# Patient Record
Sex: Female | Born: 1955
Health system: Southern US, Community
[De-identification: ages and names within clinical notes are randomized; demographics above are authoritative.]

## PROBLEM LIST (undated history)

## (undated) DIAGNOSIS — R3129 Other microscopic hematuria: Secondary | ICD-10-CM

## (undated) DIAGNOSIS — Q631 Lobulated, fused and horseshoe kidney: Secondary | ICD-10-CM

## (undated) DIAGNOSIS — R87613 High grade squamous intraepithelial lesion on cytologic smear of cervix (HGSIL): Secondary | ICD-10-CM

## (undated) DIAGNOSIS — I700 Atherosclerosis of aorta: Secondary | ICD-10-CM

## (undated) DIAGNOSIS — I10 Essential (primary) hypertension: Secondary | ICD-10-CM

## (undated) DIAGNOSIS — E7800 Pure hypercholesterolemia, unspecified: Secondary | ICD-10-CM

## (undated) DIAGNOSIS — N891 Moderate vaginal dysplasia: Secondary | ICD-10-CM

## (undated) DIAGNOSIS — R87619 Unspecified abnormal cytological findings in specimens from cervix uteri: Secondary | ICD-10-CM

## (undated) DIAGNOSIS — D3500 Benign neoplasm of unspecified adrenal gland: Secondary | ICD-10-CM

## (undated) HISTORY — PX: PARTIAL HYSTERECTOMY: SHX80

## (undated) HISTORY — PX: COLPOSCOPY: SHX161

## (undated) HISTORY — DX: High grade squamous intraepithelial lesion on cytologic smear of cervix (HGSIL): R87.613

## (undated) HISTORY — DX: Unspecified abnormal cytological findings in specimens from cervix uteri: R87.619

## (undated) HISTORY — DX: Essential (primary) hypertension: I10

## (undated) HISTORY — DX: Pure hypercholesterolemia, unspecified: E78.00

---

## 2000-10-26 ENCOUNTER — Other Ambulatory Visit: Admission: RE | Admit: 2000-10-26 | Discharge: 2000-10-26 | Payer: Self-pay | Admitting: Obstetrics and Gynecology

## 2001-02-11 ENCOUNTER — Encounter: Payer: Self-pay | Admitting: Family Medicine

## 2001-02-11 ENCOUNTER — Ambulatory Visit (HOSPITAL_COMMUNITY): Admission: RE | Admit: 2001-02-11 | Discharge: 2001-02-11 | Payer: Self-pay | Admitting: Family Medicine

## 2001-07-04 ENCOUNTER — Ambulatory Visit (HOSPITAL_COMMUNITY): Admission: RE | Admit: 2001-07-04 | Discharge: 2001-07-04 | Payer: Self-pay | Admitting: Family Medicine

## 2001-07-04 ENCOUNTER — Encounter: Payer: Self-pay | Admitting: Family Medicine

## 2001-11-14 ENCOUNTER — Ambulatory Visit (HOSPITAL_COMMUNITY): Admission: RE | Admit: 2001-11-14 | Discharge: 2001-11-14 | Payer: Self-pay | Admitting: Internal Medicine

## 2002-02-24 ENCOUNTER — Ambulatory Visit (HOSPITAL_COMMUNITY): Admission: RE | Admit: 2002-02-24 | Discharge: 2002-02-24 | Payer: Self-pay | Admitting: Family Medicine

## 2002-02-24 ENCOUNTER — Encounter: Payer: Self-pay | Admitting: Family Medicine

## 2002-07-08 ENCOUNTER — Encounter: Payer: Self-pay | Admitting: Family Medicine

## 2002-07-08 ENCOUNTER — Ambulatory Visit (HOSPITAL_COMMUNITY): Admission: RE | Admit: 2002-07-08 | Discharge: 2002-07-08 | Payer: Self-pay | Admitting: Family Medicine

## 2003-03-09 ENCOUNTER — Ambulatory Visit (HOSPITAL_COMMUNITY): Admission: RE | Admit: 2003-03-09 | Discharge: 2003-03-09 | Payer: Self-pay | Admitting: Family Medicine

## 2003-12-16 ENCOUNTER — Ambulatory Visit (HOSPITAL_COMMUNITY): Admission: RE | Admit: 2003-12-16 | Discharge: 2003-12-16 | Payer: Self-pay | Admitting: Family Medicine

## 2004-01-24 ENCOUNTER — Emergency Department (HOSPITAL_COMMUNITY): Admission: EM | Admit: 2004-01-24 | Discharge: 2004-01-25 | Payer: Self-pay | Admitting: Emergency Medicine

## 2004-10-12 ENCOUNTER — Emergency Department: Payer: Self-pay | Admitting: General Practice

## 2004-10-21 ENCOUNTER — Other Ambulatory Visit: Payer: Self-pay

## 2004-10-21 ENCOUNTER — Ambulatory Visit: Payer: Self-pay | Admitting: Family Medicine

## 2004-10-27 ENCOUNTER — Emergency Department: Payer: Self-pay | Admitting: Emergency Medicine

## 2004-10-27 ENCOUNTER — Other Ambulatory Visit: Payer: Self-pay

## 2005-02-17 ENCOUNTER — Ambulatory Visit (HOSPITAL_COMMUNITY): Admission: RE | Admit: 2005-02-17 | Discharge: 2005-02-17 | Payer: Self-pay | Admitting: Family Medicine

## 2007-12-17 ENCOUNTER — Emergency Department: Payer: Self-pay | Admitting: Emergency Medicine

## 2008-04-24 ENCOUNTER — Emergency Department: Payer: Self-pay | Admitting: Emergency Medicine

## 2009-06-09 ENCOUNTER — Ambulatory Visit: Payer: Self-pay | Admitting: Internal Medicine

## 2009-07-21 ENCOUNTER — Emergency Department: Payer: Self-pay | Admitting: Emergency Medicine

## 2009-08-20 ENCOUNTER — Ambulatory Visit: Payer: Self-pay | Admitting: Gastroenterology

## 2009-08-24 ENCOUNTER — Ambulatory Visit: Payer: Self-pay | Admitting: Gastroenterology

## 2010-06-28 ENCOUNTER — Ambulatory Visit: Payer: Self-pay | Admitting: Internal Medicine

## 2011-07-12 ENCOUNTER — Ambulatory Visit: Payer: Self-pay | Admitting: Internal Medicine

## 2012-07-16 ENCOUNTER — Ambulatory Visit: Payer: Self-pay

## 2013-07-23 ENCOUNTER — Ambulatory Visit: Payer: Self-pay | Admitting: Internal Medicine

## 2013-11-11 ENCOUNTER — Ambulatory Visit: Payer: Self-pay | Admitting: Obstetrics and Gynecology

## 2013-11-11 LAB — BASIC METABOLIC PANEL
ANION GAP: 6 — AB (ref 7–16)
BUN: 12 mg/dL (ref 7–18)
CALCIUM: 9 mg/dL (ref 8.5–10.1)
CHLORIDE: 103 mmol/L (ref 98–107)
CREATININE: 0.83 mg/dL (ref 0.60–1.30)
Co2: 30 mmol/L (ref 21–32)
Glucose: 89 mg/dL (ref 65–99)
Osmolality: 277 (ref 275–301)
Potassium: 4.1 mmol/L (ref 3.5–5.1)
Sodium: 139 mmol/L (ref 136–145)

## 2013-11-11 LAB — HEMOGLOBIN: HGB: 12.8 g/dL (ref 12.0–16.0)

## 2013-11-21 ENCOUNTER — Ambulatory Visit: Payer: Self-pay | Admitting: Obstetrics and Gynecology

## 2013-11-22 LAB — BASIC METABOLIC PANEL
ANION GAP: 9 (ref 7–16)
BUN: 9 mg/dL (ref 7–18)
CALCIUM: 8 mg/dL — AB (ref 8.5–10.1)
CREATININE: 0.88 mg/dL (ref 0.60–1.30)
Chloride: 105 mmol/L (ref 98–107)
Co2: 28 mmol/L (ref 21–32)
EGFR (Non-African Amer.): >60
Glucose: 97 mg/dL (ref 65–99)
Osmolality: 282 (ref 275–301)
Potassium: 3.9 mmol/L (ref 3.5–5.1)
SODIUM: 142 mmol/L (ref 136–145)

## 2013-11-22 LAB — HEMATOCRIT: HCT: 34.9 % — AB (ref 35.0–47.0)

## 2013-11-26 LAB — PATHOLOGY REPORT

## 2014-07-27 ENCOUNTER — Ambulatory Visit
Admit: 2014-07-27 | Disposition: A | Discharge status: Home / Self Care | Payer: Self-pay | Attending: Internal Medicine | Admitting: Internal Medicine

## 2014-08-01 NOTE — Op Note (Signed)
PATIENT NAME:  Kathleen Riddle, Kathleen Riddle MR#:  [id number] DATE OF BIRTH:  January 11, 1956  DATE OF PROCEDURE:  11/21/2013  PREOPERATIVE DIAGNOSIS: Persistent cervical dysplasia.   POSTOPERATIVE DIAGNOSIS: Persistent cervical dysplasia.   PROCEDURES:  1.  Total vaginal hysterectomy.  2.  Bilateral salpingectomy.   SURGEON: Boykin Nearing, M.D.   ANESTHESIA: General endotracheal.  INDICATIONS: A 59 year old gravida 3, para 3, a patient with persistent cervical dysplasia. The patient is status post a LEEP procedure in June 2014 that showed HGSIL with positive margins. A repeat colposcopic evaluation in July 2015 showed high-grade squamous intraepithelial neoplasia and positive endocervical curettage.   DESCRIPTION OF PROCEDURE: After adequate general endotracheal anesthesia, the patient was placed in the dorsal supine position with the legs in the candy-cane stirrups. The patient's lower abdomen, perineum, and vagina were prepped and draped in normal sterile fashion. She did receive 2 grams IV cefoxitin prior to commencement of the case. A Red Robinson catheter was used to straight catheterize the bladder which yielded 100 mL clear urine. A weighted speculum was placed in the posterior vaginal vault and the anterior vagina was elevated with a Sims retractor. The cervix was grasped with 2 thyroid tenacula and circumferentially injected with 1% lidocaine with 1:100,000 epinephrine. A direct posterior colpotomy incision was made. Once entrance was accomplished, a long bill weighted speculum was placed. The uterosacral ligaments were then bilaterally clamped, transected and suture ligated with 0 Vicryl suture and tagged for later identification. The anterior cervix was circumferentially incised with the Bovie. The anterior cul-de-sac was entered sharply without difficulty. The cardinal ligaments were then bilaterally clamped, transected and suture ligated with 0 Vicryl suture. A Deaver retractor was placed in the  anterior cul-de-sac to reflect the bladder anteriorly. Uterine arteries were bilaterally clamped, transected and suture ligated with 0 Vicryl suture. The cornua were then bilaterally clamped, transected and the uterus was delivered without difficulty. The pedicles were ligated with 0 Vicryl suture. Each fallopian tube was grasped with a Babcock clamp and a Heaney-Ballantine clamp was used to clamp the fallopian tube and each fallopian tube was removed and an 0 Vicryl suture was used to tie the pedicle. Good hemostasis was noted. The ovaries were small and appeared normal. Good hemostasis was noted. The peritoneum was then closed with a 2-0 PDS suture in a pursestring fashion and the vaginal cuff was then closed with a running 0 Vicryl suture. The uterosacral ligaments were plicated centrally and the rest of the vaginal vault was closed without difficulty. Good hemostasis was noted. There were no complications. Foley catheter was placed at the end of the case yielding clear urine. Estimated blood loss 25 mL. Intraoperative fluids 800 mL. Urine output 150 mL. The patient was taken to the recovery room in good condition.   ____________________________ Boykin Nearing, MD tjs:sb D: 11/21/2013 08:52:47 ET T: 11/21/2013 11:58:42 ET JOB#: [id number]  cc: Boykin Nearing, MD, <Dictator> Boykin Nearing MD ELECTRONICALLY SIGNED 11/21/2013 17:19

## 2015-07-28 ENCOUNTER — Other Ambulatory Visit: Payer: Self-pay | Admitting: Internal Medicine

## 2015-07-28 DIAGNOSIS — Z1231 Encounter for screening mammogram for malignant neoplasm of breast: Secondary | ICD-10-CM

## 2015-08-11 ENCOUNTER — Ambulatory Visit: Payer: Self-pay | Attending: Internal Medicine

## 2015-09-02 ENCOUNTER — Ambulatory Visit: Payer: Self-pay | Attending: Internal Medicine

## 2015-10-01 ENCOUNTER — Ambulatory Visit
Admission: RE | Admit: 2015-10-01 | Discharge: 2015-10-01 | Disposition: A | Discharge status: Home / Self Care | Payer: Commercial Managed Care - HMO | Source: Ambulatory Visit | Attending: Internal Medicine | Admitting: Internal Medicine

## 2015-10-01 ENCOUNTER — Other Ambulatory Visit: Payer: Self-pay | Admitting: Internal Medicine

## 2015-10-01 DIAGNOSIS — Z1231 Encounter for screening mammogram for malignant neoplasm of breast: Secondary | ICD-10-CM

## 2016-05-05 DIAGNOSIS — R87613 High grade squamous intraepithelial lesion on cytologic smear of cervix (HGSIL): Secondary | ICD-10-CM | POA: Diagnosis not present

## 2016-05-05 DIAGNOSIS — I10 Essential (primary) hypertension: Secondary | ICD-10-CM | POA: Diagnosis not present

## 2016-07-31 ENCOUNTER — Other Ambulatory Visit: Payer: Self-pay | Admitting: Internal Medicine

## 2016-07-31 DIAGNOSIS — Z1231 Encounter for screening mammogram for malignant neoplasm of breast: Secondary | ICD-10-CM

## 2016-10-02 ENCOUNTER — Ambulatory Visit
Admission: RE | Admit: 2016-10-02 | Discharge: 2016-10-02 | Disposition: A | Discharge status: Home / Self Care | Payer: Commercial Managed Care - HMO | Source: Ambulatory Visit | Attending: Internal Medicine | Admitting: Internal Medicine

## 2016-10-02 DIAGNOSIS — Z1231 Encounter for screening mammogram for malignant neoplasm of breast: Secondary | ICD-10-CM | POA: Diagnosis not present

## 2016-10-03 ENCOUNTER — Encounter: Payer: Self-pay | Admitting: Obstetrics and Gynecology

## 2016-10-03 ENCOUNTER — Ambulatory Visit (INDEPENDENT_AMBULATORY_CARE_PROVIDER_SITE_OTHER): Payer: 59 | Admitting: Obstetrics and Gynecology

## 2016-10-03 VITALS — BP 134/74 | Ht 62.0 in | Wt 154.0 lb

## 2016-10-03 DIAGNOSIS — N891 Moderate vaginal dysplasia: Secondary | ICD-10-CM

## 2016-10-03 NOTE — Progress Notes (Signed)
Obstetrics & Gynecology Office Visit   Chief Complaint  Patient presents with  . Referral appointment for history of VAIN   History of Present Illness: 61 y.o. G32P3003 female who presents in referral from Dr. Glendon Axe at [facility] for history of VAIN 2. She had been followed at [facility] for this and was last seen for this issue in June 2016, where she had a vaginal biopsy at 9 o'clock on the vagina, which was consistent with VAIN 2.  She has had no follow up for this since that time.  She reports no issues. She denies vaginal bleeding.  Past Medical History:  Diagnosis Date  . Abnormal Pap smear of cervix   . HGSIL (high grade squamous intraepithelial lesion) on Pap smear of cervix   . Hypercholesteremia   . Hypertension     Past Surgical History:  Procedure Laterality Date  . COLPOSCOPY    . PARTIAL HYSTERECTOMY      Gynecologic History: No LMP recorded. Patient has had a hysterectomy.  Obstetric History: [id number]  Family History  Problem Relation Age of Onset  . Breast cancer Neg Hx     Social History   Social History  . Marital status: Divorced    Spouse name: N/A  . Number of children: N/A  . Years of education: N/A   Occupational History  . Not on file.   Social History Main Topics  . Smoking status: Current Every Day Smoker  . Smokeless tobacco: Never Used  . Alcohol use No  . Drug use: No  . Sexual activity: Yes   Other Topics Concern  . Not on file   Social History Narrative  . No narrative on file    No Known Allergies  Prior to Admission medications   Not on File    Review of Systems  Constitutional: Negative.   HENT: Negative.   Eyes: Negative.   Respiratory: Negative.   Cardiovascular: Negative.   Gastrointestinal: Negative.   Genitourinary: Negative.   Musculoskeletal: Negative.   Skin: Negative.   Neurological: Negative.   Psychiatric/Behavioral: Negative.      Physical Exam BP 134/74   Ht 5\' 2"  (1.575 m)    Wt 154 lb (69.9 kg)   BMI 28.17 kg/m  No LMP recorded. Patient has had a hysterectomy. Physical Exam  Constitutional: She is oriented to person, place, and time. She appears well-developed and well-nourished. No distress.  Genitourinary: Vagina normal. Pelvic exam was performed with patient supine. There is no rash, tenderness, lesion or injury on the right labia. There is no rash, tenderness, lesion or injury on the left labia. Vagina exhibits no lesion. No erythema, tenderness or bleeding in the vagina. No foreign body in the vagina. No signs of injury around the vagina. No vaginal discharge found. Right adnexum does not display mass, does not display tenderness and does not display fullness. Left adnexum does not display tenderness and does not display fullness.  Genitourinary Comments: Cervix surgically absent as well as uterus. Sample taken for pap smear of vagina.   HENT:  Head: Normocephalic and atraumatic.  Eyes: EOM are normal. No scleral icterus.  Neck: Normal range of motion. Neck supple.  Cardiovascular: Normal rate and regular rhythm.   Pulmonary/Chest: Effort normal and breath sounds normal. No respiratory distress. She has no wheezes. She has no rales.  Abdominal: Soft. Bowel sounds are normal. She exhibits no distension and no mass. There is no tenderness. There is no rebound and no guarding.  Musculoskeletal:  Normal range of motion. She exhibits no edema.  Neurological: She is alert and oriented to person, place, and time. No cranial nerve deficit.  Skin: Skin is warm and dry. No erythema.  Psychiatric: She has a normal mood and affect. Her behavior is normal. Judgment normal.   Female chaperone present for pelvic and breast  portions of the physical exam  Assessment: 61 y.o. G37P3003 female seen in referral for VAIN 2.  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 We will arrange for colposcopy of vagina to ensure resolution of VAIN 2.    30 minutes spent in face to face discussion with > 50% spent in counseling and management of her VAIN 2 and the high importance of follow up so that she does not develop cancer of the vagina.   Prentice Docker, MD 10/06/2016 1:20 PM    CC: Dr. Glendon Axe [facility] [place], [place]

## 2016-10-07 LAB — IGP, APTIMA HPV, RFX 16/18,45
HPV Aptima: POSITIVE — AB
PAP SMEAR COMMENT: 0

## 2016-10-12 ENCOUNTER — Encounter: Payer: Self-pay | Admitting: Obstetrics and Gynecology

## 2016-10-27 DIAGNOSIS — I10 Essential (primary) hypertension: Secondary | ICD-10-CM | POA: Diagnosis not present

## 2016-10-27 DIAGNOSIS — Z131 Encounter for screening for diabetes mellitus: Secondary | ICD-10-CM | POA: Diagnosis not present

## 2016-11-01 ENCOUNTER — Ambulatory Visit (INDEPENDENT_AMBULATORY_CARE_PROVIDER_SITE_OTHER): Payer: 59 | Admitting: Obstetrics and Gynecology

## 2016-11-01 ENCOUNTER — Encounter: Payer: Self-pay | Admitting: Obstetrics and Gynecology

## 2016-11-01 VITALS — BP 128/78 | Ht 62.0 in | Wt 155.0 lb

## 2016-11-01 DIAGNOSIS — N891 Moderate vaginal dysplasia: Secondary | ICD-10-CM

## 2016-11-01 NOTE — Progress Notes (Addendum)
Referring Provider:  Dr. Glendon Axe at [facility]  HPI:  Kathleen Riddle is a 61 y.o.  [phone]  who presents today for evaluation and management of abnormal cervical cytology.    Dysplasia History:  VAIN 2 in 2016. Patient is status post TVH for persistent cervical dysplasia.   ROS:  Pertinent items are noted in HPI.  OB History  Gravida Para Term Preterm AB Living  3 3 3     3   SAB TAB Ectopic Multiple Live Births          3    # Outcome Date GA Lbr Len/2nd Weight Sex Delivery Anes PTL Lv  3 Term      Vag-Spont   LIV  2 Term      Vag-Spont     1 Term      Vag-Spont         Past Medical History:  Diagnosis Date  . Abnormal Pap smear of cervix   . HGSIL (high grade squamous intraepithelial lesion) on Pap smear of cervix   . Hypercholesteremia   . Hypertension     Past Surgical History:  Procedure Laterality Date  . COLPOSCOPY    . PARTIAL HYSTERECTOMY      SOCIAL HISTORY: History  Alcohol Use No   History  Drug Use No     Family History  Problem Relation Age of Onset  . Breast cancer Neg Hx     ALLERGIES:  Patient has no known allergies.  No current outpatient prescriptions on file prior to visit.   No current facility-administered medications on file prior to visit.     Physical Exam: -Vitals:  BP 128/78   Ht 5\' 2"  (1.575 m)   Wt 155 lb (70.3 kg)   BMI 28.35 kg/m  GEN: WD, WN, NAD.  A+ O x 3, good mood and affect. ABD:  NT, ND.  Soft, no masses.  No hernias noted.   Pelvic:   Vulva: Normal appearance.  No lesions.  Vagina: No lesions or abnormalities noted.  Support: Normal pelvic support.  Urethra No masses tenderness or scarring.  Meatus Normal size without lesions or prolapse.  Cervix: See below.  Anus: Normal exam.  No lesions.  Perineum: Normal exam.  No lesions.        Bimanual   Uterus: Normal size.  Non-tender.  Mobile.  AV.  Adnexae: No masses.  Non-tender to palpation.  Cul-de-sac: Negative for abnormality.    PROCEDURE: 1.  Urine Pregnancy Test:  not done 2.  Colposcopy performed with 4% acetic acid after verbal consent obtained                                         -Aceto-white Lesions Location(s): 9 o'clock on vaginal cuff              -Biopsy performed at 9 o'clock               -ECC indicated and performed: No.     -Biopsy sites made hemostatic with pressure, AgNO3, and/or Monsel's solution   -Satisfactory colposcopy: Yes.      -Evidence of Invasive vaginal CA :  NO  ASSESSMENT:  Kathleen Riddle is a 61 y.o. S9G2836 here for  1. VAIN II (vaginal intraepithelial neoplasia grade II)    PLAN: I discussed the grading system of pap smears and HPV high  risk viral types.  We will discuss and base management after colpo results return. Follow up PAP 6 months, vs intervention if high grade dysplasia identified      Prentice Docker, MD  [place] 11/02/2016  9:15 AM   ADDENDUM: Biopsy result shows benign squamous epithelium.  Recommend repeat pap smear with HPV testing in one year.  Letter has been sent to patient.  CC: Dr. Glendon Axe [facility], [place]

## 2016-11-03 DIAGNOSIS — Z780 Asymptomatic menopausal state: Secondary | ICD-10-CM | POA: Diagnosis not present

## 2016-11-03 DIAGNOSIS — Z0000 Encounter for general adult medical examination without abnormal findings: Secondary | ICD-10-CM | POA: Diagnosis not present

## 2016-11-03 DIAGNOSIS — M25562 Pain in left knee: Secondary | ICD-10-CM | POA: Diagnosis not present

## 2016-11-03 DIAGNOSIS — R3129 Other microscopic hematuria: Secondary | ICD-10-CM | POA: Diagnosis not present

## 2016-11-03 LAB — PATHOLOGY

## 2016-11-06 ENCOUNTER — Encounter: Payer: Self-pay | Admitting: Obstetrics and Gynecology

## 2016-11-14 DIAGNOSIS — Z780 Asymptomatic menopausal state: Secondary | ICD-10-CM | POA: Diagnosis not present

## 2016-12-06 DIAGNOSIS — M778 Other enthesopathies, not elsewhere classified: Secondary | ICD-10-CM | POA: Diagnosis not present

## 2016-12-06 DIAGNOSIS — R3129 Other microscopic hematuria: Secondary | ICD-10-CM | POA: Diagnosis not present

## 2016-12-12 ENCOUNTER — Other Ambulatory Visit: Payer: Self-pay | Admitting: Internal Medicine

## 2016-12-12 DIAGNOSIS — R3129 Other microscopic hematuria: Secondary | ICD-10-CM

## 2016-12-21 ENCOUNTER — Ambulatory Visit
Admission: RE | Admit: 2016-12-21 | Discharge: 2016-12-21 | Disposition: A | Discharge status: Home / Self Care | Payer: 59 | Source: Ambulatory Visit | Attending: Internal Medicine | Admitting: Internal Medicine

## 2016-12-21 DIAGNOSIS — I700 Atherosclerosis of aorta: Secondary | ICD-10-CM | POA: Insufficient documentation

## 2016-12-21 DIAGNOSIS — Q632 Ectopic kidney: Secondary | ICD-10-CM | POA: Diagnosis not present

## 2016-12-21 DIAGNOSIS — R3129 Other microscopic hematuria: Secondary | ICD-10-CM | POA: Insufficient documentation

## 2016-12-21 DIAGNOSIS — M2578 Osteophyte, vertebrae: Secondary | ICD-10-CM | POA: Diagnosis not present

## 2016-12-21 LAB — POCT I-STAT CREATININE: CREATININE: 0.8 mg/dL (ref 0.44–1.00)

## 2016-12-21 MED ORDER — IOPAMIDOL (ISOVUE-300) INJECTION 61%
125.0000 mL | Freq: Once | INTRAVENOUS | Status: AC | PRN
  Administered 2016-12-21: 125 mL via INTRAVENOUS

## 2016-12-27 ENCOUNTER — Ambulatory Visit: Payer: Self-pay

## 2017-01-09 ENCOUNTER — Encounter: Payer: Self-pay | Admitting: Urology

## 2017-01-09 ENCOUNTER — Ambulatory Visit (INDEPENDENT_AMBULATORY_CARE_PROVIDER_SITE_OTHER): Payer: 59 | Admitting: Urology

## 2017-01-09 VITALS — BP 151/78 | HR 97 | Ht 62.0 in | Wt 150.0 lb

## 2017-01-09 DIAGNOSIS — R3129 Other microscopic hematuria: Secondary | ICD-10-CM

## 2017-01-09 LAB — URINALYSIS, COMPLETE
BILIRUBIN UA: NEGATIVE
GLUCOSE, UA: NEGATIVE
Ketones, UA: NEGATIVE
LEUKOCYTES UA: NEGATIVE
Nitrite, UA: NEGATIVE
PH UA: 5.5 (ref 5.0–7.5)
PROTEIN UA: NEGATIVE
Specific Gravity, UA: 1.015 (ref 1.005–1.030)
UUROB: 0.2 mg/dL (ref 0.2–1.0)

## 2017-01-09 LAB — MICROSCOPIC EXAMINATION
BACTERIA UA: NONE SEEN
RBC, UA: NONE SEEN /hpf (ref 0–?)
WBC UA: NONE SEEN /HPF (ref 0–?)

## 2017-01-09 NOTE — Progress Notes (Signed)
01/09/2017 4:03 PM   Kathleen Riddle 10/29/1955 [id number]  Referring provider: Glendon Axe, MD [facility] [place], [place] [address]  Chief Complaint  Patient presents with  . Hematuria    HPI:  Consultation for microscopic hematuria. Patient underwent urinalysis July and August 2018 in both showed 4-10 red blood cells per high-powered field. Urine cultures were sent and grew mixed growth. Creatinine runs between 0.8 and 0.9. She underwent a CT scan of the abdomen and pelvis 12/22/2016 which showed crossed fused ectopy of the kidneys. The right kidney is more medial and inferior. I reviewed all the images. Her UA is clear today. She has occasional frequency and nocturia. No prior urologic history. She is a smoker. She only drinks Mt Dew.    PMH: Past Medical History:  Diagnosis Date  . Abnormal Pap smear of cervix   . HGSIL (high grade squamous intraepithelial lesion) on Pap smear of cervix   . Hypercholesteremia   . Hypertension     Surgical History: Past Surgical History:  Procedure Laterality Date  . COLPOSCOPY    . PARTIAL HYSTERECTOMY      Home Medications:  Allergies as of 01/09/2017   No Known Allergies     Medication List       Accurate as of 01/09/17  4:03 PM. Always use your most recent med list.          amLODipine 2.5 MG tablet Commonly known as:  NORVASC Take by mouth.   citalopram 20 MG tablet Commonly known as:  CELEXA Take by mouth.   etodolac 400 MG tablet Commonly known as:  LODINE Take by mouth.   meloxicam 7.5 MG tablet Commonly known as:  MOBIC 1-2 tablets as needed for pain.       Allergies: No Known Allergies  Family History: Family History  Problem Relation Age of Onset  . Breast cancer Neg Hx   . Bladder Cancer Neg Hx   . Kidney cancer Neg Hx     Social History:  reports that she has been smoking.  She has never used smokeless tobacco. She reports that she does not drink alcohol or use  drugs.  ROS: UROLOGY Frequent Urination?: Yes Hard to postpone urination?: No Burning/pain with urination?: No Get up at night to urinate?: Yes Leakage of urine?: No Urine stream starts and stops?: No Trouble starting stream?: No Do you have to strain to urinate?: No Blood in urine?: No Urinary tract infection?: No Sexually transmitted disease?: No Injury to kidneys or bladder?: No Painful intercourse?: No Weak stream?: No Currently pregnant?: No Vaginal bleeding?: No Last menstrual period?: n  Gastrointestinal Nausea?: No Vomiting?: No Indigestion/heartburn?: No Diarrhea?: No Constipation?: No  Constitutional Fever: No Night sweats?: No Weight loss?: No Fatigue?: No  Skin Skin rash/lesions?: No Itching?: No  Eyes Blurred vision?: No Double vision?: No  Ears/Nose/Throat Sore throat?: No Sinus problems?: No  Hematologic/Lymphatic Swollen glands?: No Easy bruising?: No  Cardiovascular Leg swelling?: No Chest pain?: No  Respiratory Cough?: No Shortness of breath?: No  Endocrine Excessive thirst?: No  Musculoskeletal Back pain?: No Joint pain?: No  Neurological Headaches?: No Dizziness?: No  Psychologic Depression?: No Anxiety?: No  Physical Exam: BP (!) 151/78 (BP Location: Right Arm, Patient Position: Sitting, Cuff Size: Normal)   Pulse 97   Ht 5\' 2"  (1.575 m)   Wt 68 kg (150 lb)   BMI 27.44 kg/m   Constitutional:  Alert and oriented, No acute distress. HEENT:  [place] AT, moist mucus membranes.  Trachea midline, no masses. Cardiovascular: No clubbing, cyanosis, or edema. Respiratory: Normal respiratory effort, no increased work of breathing. GI: Abdomen is soft, nontender, nondistended, no abdominal masses GU: No CVA tenderness.  Skin: No rashes, bruises or suspicious lesions. Neurologic: Grossly intact, no focal deficits, moving all 4 extremities. Psychiatric: Normal mood and affect.  Laboratory Data: Lab Results  Component Value  Date   HGB 12.8 11/11/2013   HCT 34.9 (L) 11/22/2013    Lab Results  Component Value Date   CREATININE 0.80 12/21/2016    No results found for: PSA1  No results found for: TESTOSTERONE  No results found for: HGBA1C  Urinalysis No results found for: SPECGRAV, PHUR, COLORU, [place], LEUKOCYTESUR, [place], GLUCOSEU, KETONESU, RBCU, BILIRUBINUR, UUROB, NITRITE  No results found for: LABMICR, [place], RBCUA, LABEPIT, MUCUS, BACTERIA  Pertinent Imaging: CT Results for orders placed in visit on 07/21/09  DG Abd 1 View   Narrative * PRIOR REPORT IMPORTED FROM AN EXTERNAL SYSTEM *   PRIOR REPORT IMPORTED FROM THE SYNGO [place]:    constipation  COMMENTS:   PROCEDURE:     DXR - DXR ABDOMEN AP ONLY  - Jul 22 2009  3:31AM   RESULT:     Air and stool are scattered through the colon to the  rectosigmoid region. There is no abnormal bowel distention. There is no  evidence of pneumatosis. The appearance is consistent with constipation.  The  bony structures appear unremarkable.   IMPRESSION:   Please see above.   Thank you for the opportunity to contribute to the care of your patient.       No results found for this or any previous visit. No results found for this or any previous visit. No results found for this or any previous visit. No results found for this or any previous visit. No results found for this or any previous visit. No results found for this or any previous visit. No results found for this or any previous visit.  Assessment & Plan:   1. Microscopic hematuria Benign CT scan. Patient will follow-up for cystoscopy and exam. We discussed the link between smoking and GU malignancies and I encouraged her to stop smoking.  - Urinalysis, Complete   No Follow-up on file.  Festus Aloe, [facility] [address], [place], [place] [address] [phone]

## 2017-01-23 ENCOUNTER — Ambulatory Visit (INDEPENDENT_AMBULATORY_CARE_PROVIDER_SITE_OTHER): Payer: 59 | Admitting: Urology

## 2017-01-23 ENCOUNTER — Encounter: Payer: Self-pay | Admitting: Urology

## 2017-01-23 VITALS — BP 153/73 | HR 81 | Ht 62.0 in | Wt 150.6 lb

## 2017-01-23 DIAGNOSIS — R3129 Other microscopic hematuria: Secondary | ICD-10-CM | POA: Diagnosis not present

## 2017-01-23 LAB — URINALYSIS, COMPLETE
Bilirubin, UA: NEGATIVE
Glucose, UA: NEGATIVE
KETONES UA: NEGATIVE
LEUKOCYTES UA: NEGATIVE
Nitrite, UA: NEGATIVE
PH UA: 7 (ref 5.0–7.5)
Protein, UA: NEGATIVE
SPEC GRAV UA: 1.015 (ref 1.005–1.030)
Urobilinogen, Ur: 0.2 mg/dL (ref 0.2–1.0)

## 2017-01-23 LAB — MICROSCOPIC EXAMINATION
BACTERIA UA: NONE SEEN
EPITHELIAL CELLS (NON RENAL): NONE SEEN /HPF (ref 0–10)
WBC, UA: NONE SEEN /hpf (ref 0–?)

## 2017-01-23 MED ORDER — LIDOCAINE HCL 2 % EX GEL
1.0000 "application " | Freq: Once | CUTANEOUS | Status: AC
  Administered 2017-01-23: 1 via URETHRAL

## 2017-01-23 MED ORDER — CIPROFLOXACIN HCL 500 MG PO TABS
500.0000 mg | ORAL_TABLET | Freq: Once | ORAL | Status: AC
  Administered 2017-01-23: 500 mg via ORAL

## 2017-01-23 NOTE — Progress Notes (Signed)
   01/23/17  CC:  Chief Complaint  Patient presents with  . Cysto    HPI:  Blood pressure (!) 153/73, pulse 81, height 5\' 2"  (1.575 m), weight 68.3 kg (150 lb 9.6 oz). NED. A&Ox3.   No respiratory distress   Abd soft, NT, ND Normal external genitalia with patent urethral meatus  Cystoscopy Procedure Note  Patient identification was confirmed, informed consent was obtained, and patient was prepped using Betadine solution.  Lidocaine jelly was administered per urethral meatus.   F/u MH. CT normal 12/22/2016 with cross fused ectopy. Here today for cystoscopy. Doing well.   Preoperative abx where received prior to procedure.  Morey Hummingbird was chaperone.   PE: Meatus normal Bladder and urethra palpably normal   Procedure: - Flexible cystoscope introduced, without any difficulty.   - Thorough search of the bladder revealed:    normal urethral meatus    normal urothelium    no stones    no ulcers     no tumors    no urethral polyps    mild trabeculation  - Ureteral orifices were normal in position and appearance.  Post-Procedure: - Patient tolerated the procedure well  Assessment/ Plan:  MH - see in 1 year or sooner if issues (dysuria, hematuria, etc).   Festus Aloe, MD

## 2017-02-26 ENCOUNTER — Encounter: Payer: Self-pay | Admitting: Emergency Medicine

## 2017-02-26 ENCOUNTER — Other Ambulatory Visit: Payer: Self-pay

## 2017-02-26 ENCOUNTER — Emergency Department
Admission: EM | Admit: 2017-02-26 | Discharge: 2017-02-26 | Disposition: A | Discharge status: Home / Self Care | Payer: Commercial Managed Care - HMO | Attending: Emergency Medicine | Admitting: Emergency Medicine

## 2017-02-26 DIAGNOSIS — Z79899 Other long term (current) drug therapy: Secondary | ICD-10-CM | POA: Insufficient documentation

## 2017-02-26 DIAGNOSIS — L235 Allergic contact dermatitis due to other chemical products: Secondary | ICD-10-CM | POA: Diagnosis not present

## 2017-02-26 DIAGNOSIS — R21 Rash and other nonspecific skin eruption: Secondary | ICD-10-CM | POA: Diagnosis present

## 2017-02-26 DIAGNOSIS — I10 Essential (primary) hypertension: Secondary | ICD-10-CM | POA: Insufficient documentation

## 2017-02-26 DIAGNOSIS — F17210 Nicotine dependence, cigarettes, uncomplicated: Secondary | ICD-10-CM | POA: Diagnosis not present

## 2017-02-26 MED ORDER — HYDROXYZINE HCL 50 MG PO TABS: 50.0000 mg | ORAL_TABLET | Freq: Three times a day (TID) | ORAL | 0 refills | Status: DC | PRN

## 2017-02-26 MED ORDER — HYDROXYZINE HCL 50 MG PO TABS
50.0000 mg | ORAL_TABLET | Freq: Once | ORAL | Status: AC
  Administered 2017-02-26: 50 mg via ORAL
  Filled 2017-02-26: qty 1

## 2017-02-26 MED ORDER — DEXAMETHASONE SODIUM PHOSPHATE 10 MG/ML IJ SOLN
10.0000 mg | Freq: Once | INTRAMUSCULAR | Status: AC
  Administered 2017-02-26: 10 mg via INTRAMUSCULAR
  Filled 2017-02-26: qty 1

## 2017-02-26 MED ORDER — METHYLPREDNISOLONE 4 MG PO TBPK: ORAL_TABLET | ORAL | 0 refills | Status: DC

## 2017-02-26 NOTE — ED Provider Notes (Signed)
[facility] Emergency Department Provider Note   ____________________________________________   None    (approximate)  I have reviewed the triage vital signs and the nursing notes.   HISTORY  Chief Complaint Rash    HPI Kathleen Riddle is a 61 y.o. female patient complaining a rash to the forehead, neck, and facial area secondary to have her hair dyed 2 days ago. Patient state increased itching. Patient denies dyspnea or oral edema. Patient stated mild relief with over-the-counter Benadryl this morning. Patient denies pain with this complaint.   Past Medical History:  Diagnosis Date  . Abnormal Pap smear of cervix   . HGSIL (high grade squamous intraepithelial lesion) on Pap smear of cervix   . Hypercholesteremia   . Hypertension     Patient Active Problem List   Diagnosis Date Noted  . 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 COLPOSCOPY    . PARTIAL HYSTERECTOMY      Prior to Admission medications   Medication Sig Start Date End Date Taking? Authorizing Provider  amLODipine (NORVASC) 2.5 MG tablet Take by mouth. 05/05/16   [provider]  citalopram (CELEXA) 20 MG tablet Take by mouth. 05/05/16   [provider]  diclofenac sodium (VOLTAREN) 1 % GEL Apply topically 4 (four) times daily.    [provider]  etodolac (LODINE) 400 MG tablet Take by mouth. 12/06/16 12/06/17  [provider]  hydrOXYzine (ATARAX/VISTARIL) 50 MG tablet Take 1 tablet (50 mg total) 3 (three) times daily as needed by mouth. 02/26/17   Sable Feil, PA-C  meloxicam (MOBIC) 7.5 MG tablet 1-2 tablets as needed for pain. 01/19/15   [provider]  methylPREDNISolone (MEDROL DOSEPAK) 4 MG TBPK tablet Take Tapered dose as directed 02/26/17   Sable Feil, PA-C    Allergies Patient has no known allergies.  Family History  Problem  Relation Age of Onset  . Breast cancer Neg Hx   . Bladder Cancer Neg Hx   . Kidney cancer Neg Hx     Social History Social History   Tobacco Use  . Smoking status: Current Every Day Smoker  . Smokeless tobacco: Never Used  Substance Use Topics  . Alcohol use: No  . Drug use: No    Review of Systems  Constitutional: No fever/chills Eyes: No visual changes. ENT: No sore throat. Cardiovascular: Denies chest pain. Respiratory: Denies shortness of breath. Gastrointestinal: No abdominal pain.  No nausea, no vomiting.  No diarrhea.  No constipation. Genitourinary: Negative for dysuria. Musculoskeletal: Negative for back pain. Skin: Positive for rash. Neurological: Negative for headaches, focal weakness or numbness. Endocrine:Hyperlipidemia hypertension  ____________________________________________   PHYSICAL EXAM:  VITAL SIGNS: ED Triage Vitals  Enc Vitals Group     BP 02/26/17 1621 (!) 144/60     Pulse Rate 02/26/17 1621 91     Resp 02/26/17 1621 16     Temp 02/26/17 1621 99.7 F (37.6 C)     Temp Source 02/26/17 1621 Oral     SpO2 02/26/17 1621 100 %     Weight 02/26/17 1622 143 lb (64.9 kg)     Height 02/26/17 1622 5\' 2"  (1.575 m)     Head Circumference --      Peak Flow --      Pain Score 02/26/17 1621 0     Pain Loc --      Pain Edu? --  Excl. in [employer]? --    Constitutional: Alert and oriented. Well appearing and in no acute distress. Mouth/Throat: Mucous membranes are moist.  Oropharynx non-erythematous. Neck: No stridor.   Cardiovascular: Normal rate, regular rhythm. Grossly normal heart sounds.  Good peripheral circulation. Respiratory: Normal respiratory effort.  No retractions. Lungs CTAB. Gastrointestinal: Soft and nontender. No distention. No abdominal bruits. No CVA tenderness. Musculoskeletal: No lower extremity tenderness nor edema.  No joint effusions. Neurologic:  Normal speech and language. No gross focal neurologic deficits are appreciated.  No gait instability. Skin:  Skin is warm, dry and intact. Erythematous macular lesion superior aspect the face and posterior neck. Psychiatric: Mood and affect are normal. Speech and behavior are normal.  ____________________________________________   LABS (all labs ordered are listed, but only abnormal results are displayed)  Labs Reviewed - No data to display ____________________________________________  EKG   ____________________________________________  RADIOLOGY  No results found.  ____________________________________________   PROCEDURES  Procedure(s) performed: None  Procedures  Critical Care performed: No  ____________________________________________   INITIAL IMPRESSION / ASSESSMENT AND PLAN / ED COURSE  As part of my medical decision making, I reviewed the following data within the electronic MEDICAL RECORD NUMBER    Contact dermatitis secondary to hair dye. Patient given discharge care instructions. Patient last take medication as directed. Patient advised to follow-up with her PCP if no improvement in 3 days. Return to ED if condition worsens.      ____________________________________________   FINAL CLINICAL IMPRESSION(S) / ED DIAGNOSES  Final diagnoses:  Allergic dermatitis due to other chemical product     ED Discharge Orders        Ordered    methylPREDNISolone (MEDROL DOSEPAK) 4 MG TBPK tablet     02/26/17 1635    hydrOXYzine (ATARAX/VISTARIL) 50 MG tablet  3 times daily PRN     02/26/17 1635       Note:  This document was prepared using Dragon voice recognition software and may include unintentional dictation errors.    Sable Feil, PA-C 02/26/17 1642    Earleen Newport, MD 02/27/17 [phone]

## 2017-02-26 NOTE — ED Triage Notes (Signed)
Pt presents with rash on her head after having her hair dyed on Saturday. She began to have itchy scalp on Sunday night, and it has continued to itch. Pt has taken benadryl and applied coconut oil.

## 2017-07-02 DIAGNOSIS — I10 Essential (primary) hypertension: Secondary | ICD-10-CM | POA: Diagnosis not present

## 2017-07-02 DIAGNOSIS — R21 Rash and other nonspecific skin eruption: Secondary | ICD-10-CM | POA: Diagnosis not present

## 2017-07-02 DIAGNOSIS — M1711 Unilateral primary osteoarthritis, right knee: Secondary | ICD-10-CM | POA: Diagnosis not present

## 2017-08-03 DIAGNOSIS — R51 Headache: Secondary | ICD-10-CM | POA: Diagnosis not present

## 2017-08-03 DIAGNOSIS — I10 Essential (primary) hypertension: Secondary | ICD-10-CM | POA: Diagnosis not present

## 2017-08-27 ENCOUNTER — Other Ambulatory Visit: Payer: Self-pay | Admitting: Internal Medicine

## 2017-08-27 DIAGNOSIS — Z1231 Encounter for screening mammogram for malignant neoplasm of breast: Secondary | ICD-10-CM

## 2017-10-03 ENCOUNTER — Ambulatory Visit
Admission: RE | Admit: 2017-10-03 | Discharge: 2017-10-03 | Disposition: A | Discharge status: Home / Self Care | Payer: 59 | Source: Ambulatory Visit | Attending: Internal Medicine | Admitting: Internal Medicine

## 2017-10-03 DIAGNOSIS — Z1231 Encounter for screening mammogram for malignant neoplasm of breast: Secondary | ICD-10-CM | POA: Insufficient documentation

## 2017-11-02 DIAGNOSIS — R2242 Localized swelling, mass and lump, left lower limb: Secondary | ICD-10-CM | POA: Diagnosis not present

## 2017-11-02 DIAGNOSIS — I10 Essential (primary) hypertension: Secondary | ICD-10-CM | POA: Diagnosis not present

## 2017-11-02 DIAGNOSIS — R7303 Prediabetes: Secondary | ICD-10-CM | POA: Diagnosis not present

## 2017-12-19 DIAGNOSIS — M19072 Primary osteoarthritis, left ankle and foot: Secondary | ICD-10-CM | POA: Diagnosis not present

## 2017-12-19 DIAGNOSIS — M898X9 Other specified disorders of bone, unspecified site: Secondary | ICD-10-CM | POA: Diagnosis not present

## 2017-12-19 DIAGNOSIS — M79672 Pain in left foot: Secondary | ICD-10-CM | POA: Diagnosis not present

## 2018-01-24 ENCOUNTER — Ambulatory Visit: Payer: 59 | Admitting: Urology

## 2018-02-11 DIAGNOSIS — I10 Essential (primary) hypertension: Secondary | ICD-10-CM | POA: Diagnosis not present

## 2018-02-18 DIAGNOSIS — I10 Essential (primary) hypertension: Secondary | ICD-10-CM | POA: Diagnosis not present

## 2018-02-18 DIAGNOSIS — R7303 Prediabetes: Secondary | ICD-10-CM | POA: Diagnosis not present

## 2018-02-28 ENCOUNTER — Telehealth: Payer: Self-pay | Admitting: Obstetrics & Gynecology

## 2018-02-28 NOTE — Telephone Encounter (Signed)
[facility] referring Vaginal intraepithelial neoplasia grade 2 ( Colpo) peer RPH. Called and left voicemail for patient to call back to be schedule

## 2018-02-28 NOTE — Telephone Encounter (Signed)
This is the one you asked about

## 2018-03-27 ENCOUNTER — Emergency Department: Payer: 59

## 2018-03-27 ENCOUNTER — Emergency Department
Admission: EM | Admit: 2018-03-27 | Discharge: 2018-03-27 | Disposition: A | Discharge status: Home / Self Care | Payer: 59 | Attending: Emergency Medicine | Admitting: Emergency Medicine

## 2018-03-27 ENCOUNTER — Other Ambulatory Visit: Payer: Self-pay

## 2018-03-27 ENCOUNTER — Encounter: Payer: Self-pay | Admitting: Emergency Medicine

## 2018-03-27 DIAGNOSIS — F17200 Nicotine dependence, unspecified, uncomplicated: Secondary | ICD-10-CM | POA: Diagnosis not present

## 2018-03-27 DIAGNOSIS — E875 Hyperkalemia: Secondary | ICD-10-CM | POA: Diagnosis not present

## 2018-03-27 DIAGNOSIS — R05 Cough: Secondary | ICD-10-CM | POA: Insufficient documentation

## 2018-03-27 DIAGNOSIS — E876 Hypokalemia: Secondary | ICD-10-CM | POA: Diagnosis not present

## 2018-03-27 DIAGNOSIS — I10 Essential (primary) hypertension: Secondary | ICD-10-CM | POA: Diagnosis not present

## 2018-03-27 DIAGNOSIS — J101 Influenza due to other identified influenza virus with other respiratory manifestations: Secondary | ICD-10-CM

## 2018-03-27 DIAGNOSIS — R531 Weakness: Secondary | ICD-10-CM | POA: Diagnosis present

## 2018-03-27 LAB — CBC WITH DIFFERENTIAL/PLATELET
Abs Immature Granulocytes: 0.01 10*3/uL (ref 0.00–0.07)
Basophils Absolute: 0 10*3/uL (ref 0.0–0.1)
Basophils Relative: 1 %
EOS PCT: 0 %
Eosinophils Absolute: 0 10*3/uL (ref 0.0–0.5)
HEMATOCRIT: 41.3 % (ref 36.0–46.0)
HEMOGLOBIN: 13.2 g/dL (ref 12.0–15.0)
Immature Granulocytes: 0 %
LYMPHS ABS: 1.2 10*3/uL (ref 0.7–4.0)
Lymphocytes Relative: 33 %
MCH: 28.3 pg (ref 26.0–34.0)
MCHC: 32 g/dL (ref 30.0–36.0)
MCV: 88.4 fL (ref 80.0–100.0)
MONO ABS: 0.6 10*3/uL (ref 0.1–1.0)
Monocytes Relative: 16 %
Neutro Abs: 1.8 10*3/uL (ref 1.7–7.7)
Neutrophils Relative %: 50 %
Platelets: 219 10*3/uL (ref 150–400)
RBC: 4.67 MIL/uL (ref 3.87–5.11)
RDW: 12.8 % (ref 11.5–15.5)
WBC: 3.6 10*3/uL — ABNORMAL LOW (ref 4.0–10.5)
nRBC: 0 % (ref 0.0–0.2)

## 2018-03-27 LAB — COMPREHENSIVE METABOLIC PANEL
ALBUMIN: 4.4 g/dL (ref 3.5–5.0)
ALK PHOS: 71 U/L (ref 38–126)
ALT: 12 U/L (ref 0–44)
AST: 21 U/L (ref 15–41)
Anion gap: 9 (ref 5–15)
BILIRUBIN TOTAL: 0.7 mg/dL (ref 0.3–1.2)
BUN: 12 mg/dL (ref 8–23)
CALCIUM: 9 mg/dL (ref 8.9–10.3)
CO2: 25 mmol/L (ref 22–32)
CREATININE: 0.91 mg/dL (ref 0.44–1.00)
Chloride: 104 mmol/L (ref 98–111)
GFR calc Af Amer: >60 mL/min (ref >60)
GFR calc non Af Amer: >60 mL/min (ref >60)
GLUCOSE: 90 mg/dL (ref 70–99)
Potassium: 3.1 mmol/L — ABNORMAL LOW (ref 3.5–5.1)
Sodium: 138 mmol/L (ref 135–145)
TOTAL PROTEIN: 8 g/dL (ref 6.5–8.1)

## 2018-03-27 LAB — INFLUENZA PANEL BY PCR (TYPE A & B)
Influenza A By PCR: POSITIVE — AB
Influenza B By PCR: NEGATIVE

## 2018-03-27 MED ORDER — POTASSIUM CHLORIDE CRYS ER 20 MEQ PO TBCR
40.0000 meq | EXTENDED_RELEASE_TABLET | Freq: Once | ORAL | Status: AC
  Administered 2018-03-27: 40 meq via ORAL
  Filled 2018-03-27: qty 2

## 2018-03-27 MED ORDER — OSELTAMIVIR PHOSPHATE 75 MG PO CAPS: 75.0000 mg | ORAL_CAPSULE | Freq: Two times a day (BID) | ORAL | 0 refills | Status: AC

## 2018-03-27 NOTE — ED Triage Notes (Signed)
Pt reports that she thinks she may be coming down with the flu. She states that she has a headache a productive cough and just feels tired. She reports that began feeling this way yesterday after work.

## 2018-03-27 NOTE — ED Provider Notes (Signed)
[facility] Emergency Department Provider Note  ____________________________________________  Time seen: Approximately 6:32 PM  I have reviewed the triage vital signs and the nursing notes.   HISTORY  Chief Complaint Headache; Cough; and Fatigue    HPI Diarra DONNIELLE ADDISON is a 62 y.o. female with a history of HTN and HL presenting for generalized weakness, feeling "wiped out," and nonproductive cough.  The patient reports she has had these symptoms since yesterday.  She has not had any congestion or rhinorrhea, sore throat, ear pain, shortness of breath, nausea vomiting or diarrhea.  She did not get her influenza vaccination this year.  Past Medical History:  Diagnosis Date  . Abnormal Pap smear of cervix   . HGSIL (high grade squamous intraepithelial lesion) on Pap smear of cervix   . Hypercholesteremia   . Hypertension     Patient Active Problem List   Diagnosis Date Noted  . 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 COLPOSCOPY    . PARTIAL HYSTERECTOMY      Current Outpatient Rx  . Order #: [id number] Class: Historical Med  . Order #: [id number] Class: Historical Med  . Order #: [id number] Class: Historical Med  . Order #: [id number] Class: Print  . Order #: [id number] Class: Historical Med  . Order #: [id number] Class: Print  . Order #: [id number] Class: Print    Allergies Patient has no known allergies.  Family History  Problem Relation Age of Onset  . Breast cancer Neg Hx   . Bladder Cancer Neg Hx   . Kidney cancer Neg Hx     Social History Social History   Tobacco Use  . Smoking status: Current Every Day Smoker  . Smokeless tobacco: Never Used  Substance Use Topics  . Alcohol use: No  . Drug use: No    Review of Systems Constitutional: No fever/chills.  No lightheadedness or syncope.  Positive general malaise. Eyes: No visual changes.  No eye discharge ENT: No  sore throat. No congestion or rhinorrhea. Cardiovascular: Denies chest pain. Denies palpitations. Respiratory: Denies shortness of breath.  Has a nonproductive cough. Gastrointestinal: No abdominal pain.  No nausea, no vomiting.  No diarrhea.  No constipation. Genitourinary: Negative for dysuria.  No urinary frequency. Musculoskeletal: Negative for back pain. Skin: Negative for rash. Neurological: Negative for headaches. No focal numbness, tingling or weakness.     ____________________________________________   PHYSICAL EXAM:  VITAL SIGNS: ED Triage Vitals  Enc Vitals Group     BP 03/27/18 1606 (!) 152/76     Pulse Rate 03/27/18 1606 92     Resp 03/27/18 1606 20     Temp 03/27/18 1606 98.9 F (37.2 C)     Temp Source 03/27/18 1606 Oral     SpO2 03/27/18 1606 99 %     Weight 03/27/18 1607 150 lb (68 kg)     Height 03/27/18 1607 5\' 3"  (1.6 m)     Head Circumference --      Peak Flow --      Pain Score 03/27/18 1606 2     Pain Loc --      Pain Edu? --      Excl. in [place]? --     Constitutional: Alert and oriented.  Answers questions appropriately. Eyes: Conjunctivae are normal.  EOMI. No scleral icterus.  No eye discharge. Head: Atraumatic. Nose: No congestion/rhinnorhea. Mouth/Throat: Mucous membranes are moist.  Posterior pharyngeal erythema, tonsillar swelling or exudate.  The posterior palate is symmetric and the uvula is midline.  No stridor, drooling or trismus. Neck: No stridor.  Supple.   Cardiovascular: Normal rate, regular rhythm. No murmurs, rubs or gallops.  Respiratory: Normal respiratory effort.  No accessory muscle use or retractions. Lungs CTAB.  No wheezes, rales or ronchi.  Intermittent mild cough. Gastrointestinal: Soft, nontender and nondistended.  No guarding or rebound.  No peritoneal signs. Musculoskeletal: No LE edema.  Neurologic:  A&Ox3.  Speech is clear.  Face and smile are symmetric.  EOMI.  Moves all extremities well. Skin:  Skin is warm, dry and  intact. No rash noted. Psychiatric: Mood and affect are normal. Speech and behavior are normal.  Normal judgement.  ____________________________________________   LABS (all labs ordered are listed, but only abnormal results are displayed)  Labs Reviewed  CBC WITH DIFFERENTIAL/PLATELET - Abnormal; Notable for the following components:      Result Value   WBC 3.6 (*)    All other components within normal limits  COMPREHENSIVE METABOLIC PANEL - Abnormal; Notable for the following components:   Potassium 3.1 (*)    All other components within normal limits  INFLUENZA PANEL BY PCR (TYPE A & B) - Abnormal; Notable for the following components:   Influenza A By PCR POSITIVE (*)    All other components within normal limits   ____________________________________________  EKG  Not indicated ____________________________________________  RADIOLOGY  Dg Chest 2 View  Result Date: 03/27/2018 CLINICAL DATA:  Cough EXAM: CHEST - 2 VIEW COMPARISON:  10/27/2004 FINDINGS: There is mild bilateral chronic interstitial thickening. There is no focal consolidation. There is no pleural effusion or pneumothorax. The heart and mediastinal contours are unremarkable. The osseous structures are unremarkable. IMPRESSION: No active cardiopulmonary disease. Electronically Signed   By: Kathreen Devoid   On: 03/27/2018 16:31    ____________________________________________   PROCEDURES  Procedure(s) performed: None  Procedures  Critical Care performed: No ____________________________________________   INITIAL IMPRESSION / ASSESSMENT AND PLAN / ED COURSE  Pertinent labs & imaging results that were available during my care of the patient were reviewed by me and considered in my medical decision making (see chart for details).  62 y.o. female with 2 days of generalized weakness, nonproductive cough, positive for influenza.  I do not hear any evidence of pneumonia and the patient is not hypoxic; chest x-ray  is not indicated at this time but I have given the patient return precautions.  The patient is not having any urinary symptoms, chest pain or shortness of breath so UA, EKG and troponin are not indicated.  The patient is safe for discharge at this time.  Follow-up instructions as well as return precautions were discussed.  ____________________________________________  FINAL CLINICAL IMPRESSION(S) / ED DIAGNOSES  Final diagnoses:  Influenza A (H1N1)  Hypokalemia         NEW MEDICATIONS STARTED DURING THIS VISIT:  New Prescriptions   OSELTAMIVIR (TAMIFLU) 75 MG CAPSULE    Take 1 capsule (75 mg total) by mouth 2 (two) times daily for 5 days.      Eula Listen, MD 03/27/18 [phone]

## 2018-03-27 NOTE — ED Notes (Signed)
Patient discharged home. Discharge paperwork reviewed with patient. Patient educated on importance to drink fluids while having the flu. Patient voiced no concerns or questions. Patient denies pain at time of discharge.

## 2018-03-27 NOTE — Discharge Instructions (Addendum)
Please drink plenty of fluid to stay well-hydrated.  You may take Tylenol or Advil for fever and/or body aches.  Return to the emergency department if you develop severe pain, lightheadedness or fainting, shortness of breath, fever that does not resolve with Tylenol or Motrin, or any other symptoms concerning to you.

## 2018-04-04 ENCOUNTER — Ambulatory Visit: Payer: 59 | Admitting: Obstetrics and Gynecology

## 2018-04-04 NOTE — Progress Notes (Deleted)
Obstetrics & Gynecology Office Visit   Chief Complaint: No chief complaint on file.   History of Present Illness:Kathleen Riddle is a 62 y.o. woman who presents today for continued surveillance for history of dysplasia. Last pap obtained on *** revealed ***.  Prior {Blank single:19197::"LEEP","colposcopy"} {Blank multiple:19196::"nn ***" revealed,"CIN I","CIN II","CIN III","CIS","AIS","Negative ECC","Positive ECC","positive margins","negative margins","not previously obtained"}    Pap/Treatment History:  1) Vaginal cuff biopsy negative 10/03/2016 2) Pap 10/03/2016 LGSIL HPV positive 3) TLH 11/21/2013 CIN II-III 4) Colposcopy July 2015 "High grade positive ECC" 5) LEEP June 2015 "High grade with positive margins"   Review of Systems: ***  Past Medical History:  Past Medical History:  Diagnosis Date  . Abnormal Pap smear of cervix   . HGSIL (high grade squamous intraepithelial lesion) on Pap smear of cervix   . Hypercholesteremia   . Hypertension     Past Surgical History:  Past Surgical History:  Procedure Laterality Date  . COLPOSCOPY    . PARTIAL HYSTERECTOMY      Gynecologic History: No LMP recorded. Patient has had a hysterectomy.  Obstetric History: [id number]  Family History:  Family History  Problem Relation Age of Onset  . Breast cancer Neg Hx   . Bladder Cancer Neg Hx   . Kidney cancer Neg Hx     Social History:  Social History   Socioeconomic History  . Marital status: Divorced    Spouse name: Not on file  . Number of children: Not on file  . Years of education: Not on file  . Highest education level: Not on file  Occupational History  . Not on file  Social Needs  . Financial resource strain: Not on file  . Food insecurity:    Worry: Not on file    Inability: Not on file  . Transportation needs:    Medical: Not on file    Non-medical: Not on file  Tobacco Use  . Smoking status: Current Every Day Smoker  . Smokeless tobacco: Never Used    Substance and Sexual Activity  . Alcohol use: No  . Drug use: No  . Sexual activity: Yes  Lifestyle  . Physical activity:    Days per week: Not on file    Minutes per session: Not on file  . Stress: Not on file  Relationships  . Social connections:    Talks on phone: Not on file    Gets together: Not on file    Attends religious service: Not on file    Active member of club or organization: Not on file    Attends meetings of clubs or organizations: Not on file    Relationship status: Not on file  . Intimate partner violence:    Fear of current or ex partner: Not on file    Emotionally abused: Not on file    Physically abused: Not on file    Forced sexual activity: Not on file  Other Topics Concern  . Not on file  Social History Narrative  . Not on file    Allergies:  No Known Allergies  Medications: Prior to Admission medications   Medication Sig Start Date End Date Taking? Authorizing Provider  amLODipine (NORVASC) 2.5 MG tablet Take by mouth. 05/05/16   [provider]  citalopram (CELEXA) 20 MG tablet Take by mouth. 05/05/16   [provider]  diclofenac sodium (VOLTAREN) 1 % GEL Apply topically 4 (four) times daily.    [provider]  hydrOXYzine (  ATARAX/VISTARIL) 50 MG tablet Take 1 tablet (50 mg total) 3 (three) times daily as needed by mouth. 02/26/17   Sable Feil, PA-C  meloxicam (MOBIC) 7.5 MG tablet 1-2 tablets as needed for pain. 01/19/15   [provider]  methylPREDNISolone (MEDROL DOSEPAK) 4 MG TBPK tablet Take Tapered dose as directed 02/26/17   Sable Feil, PA-C    Physical Exam Vitals: There were no vitals filed for this visit. No LMP recorded. Patient has had a hysterectomy.  General: NAD HEENT: normocephalic, anicteric Thyroid: no enlargement, no palpable nodules Pulmonary: No increased work of breathing Genitourinary:  External: Normal external female genitalia.  Normal urethral meatus, normal   Bartholin's and Skene's glands.    Vagina: Normal vaginal mucosa, no evidence of prolapse.    Cervix: Grossly normal in appearance, no bleeding  Uterus: Non-enlarged, mobile, normal contour.  No CMT  Adnexa: ovaries non-enlarged, no adnexal masses  Rectal: deferred  Lymphatic: no evidence of inguinal lymphadenopathy Extremities: no edema, erythema, or tenderness Neurologic: Grossly intact Psychiatric: mood appropriate, affect full  Female chaperone present for pelvic and breast  portions of the physical exam  Assessment: 62 y.o. G3P3003 follow up for ***  Plan: Problem List Items Addressed This Visit    None      - Follow up pap smear from today.   - I had a lengthly discussion with Tenelle Despina Hidden  regarding the cause of dysplasia of the lower genital tract (including immunosuppression in the setting of HPV exposure and tobacco exposure). I explained the potential for progression to invasive malignancy, the recurrent nature of these lesions (and the need for close continued followup). Results of today's pap will dictate need for further evaluation and follow up per ASCCP guidelines..  - She is comfortable with the plan and had her questions answered.  - No follow-ups on file.   [name], MD, [facility], [facility] 04/04/2018, 8:10 AM

## 2018-05-03 ENCOUNTER — Encounter: Payer: Self-pay | Admitting: Obstetrics and Gynecology

## 2018-05-03 ENCOUNTER — Other Ambulatory Visit (HOSPITAL_COMMUNITY)
Admission: RE | Admit: 2018-05-03 | Discharge: 2018-05-03 | Disposition: A | Discharge status: Home / Self Care | Payer: 59 | Source: Ambulatory Visit | Attending: Obstetrics and Gynecology | Admitting: Obstetrics and Gynecology

## 2018-05-03 ENCOUNTER — Ambulatory Visit: Payer: 59 | Admitting: Obstetrics and Gynecology

## 2018-05-03 VITALS — BP 130/82 | HR 69 | Ht 62.0 in | Wt 142.0 lb

## 2018-05-03 DIAGNOSIS — N891 Moderate vaginal dysplasia: Secondary | ICD-10-CM | POA: Insufficient documentation

## 2018-05-03 NOTE — Progress Notes (Signed)
Obstetrics & Gynecology Office Visit   Chief Complaint:  Chief Complaint  Patient presents with  . Colposcopy     Referred by Dr. Candiss Norse, [facility]    History of Present Illness:Kathleen Riddle is a 63 y.o. woman who presents today for continued surveillance for history of dysplasia (VAIN 2). Last pap obtained on 10/03/2016 revealed LGSIL HPV positive.  Follow up colposcopy revealed no dysplastic changes.  Pap/Treatment History:  11/01/2016 Colposcopy negative 10/03/2016 Pap LGSIL HPV positive 02/18/2015 Colposcopy VAIN 2 at 9 O'Clock biopsy 08/07/2014 Colposcopy VAIN 2 at 9 O'Clock biopsy 06/18/2014 Pap LGSIL  11/21/2013 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 at 12 to 3 O'Clock 10/09/2013 Colposcopy CIN II at 2 O;Clock, ECC HGSIL 09/14/2013 Pap LGSIL  Review of Systems: Review of Systems  Constitutional: Negative.   Genitourinary: Negative.   Skin: Negative.    Past Medical History:  Past Medical History:  Diagnosis Date  . Abnormal Pap smear of cervix   . HGSIL (high grade squamous intraepithelial lesion) on Pap smear of cervix   . Hypercholesteremia   . Hypertension     Past Surgical History:  Past Surgical History:  Procedure Laterality Date  . COLPOSCOPY    . PARTIAL HYSTERECTOMY      Gynecologic History: No LMP recorded. Patient has had a hysterectomy.  Obstetric History: [id number]  Family History:  Family History  Problem Relation Age of Onset  . Breast cancer Neg Hx   . Bladder Cancer Neg Hx   . Kidney cancer Neg Hx     Social History:  Social History   Socioeconomic History  . Marital status: Divorced    Spouse name: Not on file  . Number of children: Not on file  . Years of education: Not on file  . Highest education level: Not on file  Occupational History  . Not on file  Social Needs  . Financial resource strain: Not on file  . Food insecurity:    Worry: Not on file    Inability: Not on file  . Transportation needs:    Medical: Not on  file    Non-medical: Not on file  Tobacco Use  . Smoking status: Current Every Day Smoker  . Smokeless tobacco: Never Used  Substance and Sexual Activity  . Alcohol use: No  . Drug use: No  . Sexual activity: Yes  Lifestyle  . Physical activity:    Days per week: Not on file    Minutes per session: Not on file  . Stress: Not on file  Relationships  . Social connections:    Talks on phone: Not on file    Gets together: Not on file    Attends religious service: Not on file    Active member of club or organization: Not on file    Attends meetings of clubs or organizations: Not on file    Relationship status: Not on file  . Intimate partner violence:    Fear of current or ex partner: Not on file    Emotionally abused: Not on file    Physically abused: Not on file    Forced sexual activity: Not on file  Other Topics Concern  . Not on file  Social History Narrative  . Not on file    Allergies:  No Known Allergies  Medications: Prior to Admission medications   Medication Sig Start Date End Date Taking? Authorizing Provider  amLODipine (NORVASC) 2.5 MG tablet Take by mouth. 05/05/16  Yes [provider]  citalopram (CELEXA) 20 MG tablet Take by mouth. 05/05/16  Yes [provider]  diclofenac sodium (VOLTAREN) 1 % GEL Apply topically 4 (four) times daily.   Yes [provider]  hydrOXYzine (ATARAX/VISTARIL) 50 MG tablet Take 1 tablet (50 mg total) 3 (three) times daily as needed by mouth. 02/26/17  Yes Sable Feil, PA-C  meloxicam (MOBIC) 7.5 MG tablet 1-2 tablets as needed for pain. 01/19/15  Yes [provider]  methylPREDNISolone (MEDROL DOSEPAK) 4 MG TBPK tablet Take Tapered dose as directed 02/26/17  Yes Sable Feil, PA-C    Physical Exam Vitals:  Vitals:   05/03/18 1433  BP: 130/82  Pulse: 69   No LMP recorded. Patient has had a hysterectomy.  General: NAD HEENT: normocephalic, anicteric Pulmonary: No increased work of  breathing Genitourinary:  External: Normal external female genitalia.  Normal urethral meatus, normal  Bartholin's and Skene's glands.    Vagina: Normal vaginal mucosa, no evidence of prolapse, no lesions  Cervix: surgically absent  Uterus: surgically absent  Adnexa: ovaries non-enlarged, no adnexal masses  Rectal: deferred  Lymphatic: no evidence of inguinal lymphadenopathy Extremities: no edema, erythema, or tenderness Neurologic: Grossly intact Psychiatric: mood appropriate, affect full  Female chaperone present for pelvic and breast  portions of the physical exam     Assessment: 63 y.o. G3P3003 follow up for history of CIN II, VAIN II following TVH 2015  Plan: Problem List Items Addressed This Visit      Genitourinary   VAIN II (vaginal intraepithelial neoplasia grade II) - Primary   Relevant Orders   Cytology - PAP      - Follow up pap smear from today.   - I had a lengthly discussion with Zettie Despina Hidden  regarding the cause of dysplasia of the lower genital tract (including immunosuppression in the setting of HPV exposure and tobacco exposure). I explained the potential for progression to invasive malignancy, the recurrent nature of these lesions (and the need for close continued followup). Results of today's pap will dictate need for further evaluation and follow up per ASCCP guidelines.  - We discussed that 80% of the population will have exposure to human papilloma virus (HPV) during their lifetime.  HPV is a large group of viruses, and are also the causative virus for common warts and genital warts.  The pap smear tests for 13 high risk HPV strains that have some association with cervical cancer, but do not cause visual lesion such as warts.  HPV type 16 and 18 have the highest association with cervical cancer.  The vast majority of HPV infections will be uncomplicated at clear spontaneously in 12-18 months in non-immunocompromised patients.  Patient with compromised  immune systems, those taking immunosuppressive drugs, or smoker have shown to have a lower clearance rate and higher persistence of HPV infection.    Currently there are no FDA approved treatments to promote HPV clearance.  Gardasil vaccination is available to prevent HPV infection, but this is only beneficial pre-exposure.  Abstaining from intercourse will not increase clearance  Lastly we stressed that if properly followed HPV should not lead to cervical cancer.   The goal of screening is to identify patient who develop precancerous lesions of the cervix and treat these prior to progression to frank cervical cancer.  The incidence of cervical cancer is 7 cases per 100,000 women in the [place] a year.  This relatively low rate is in part due to universal screening as  well as vaccination efforts.    - Women with a history of CIN2 or amore severe diagnosis shouldcontinue routine screening for at least 20 years.   [place], [place] for Colposcopy and Cervical Pathology, and American Society for Clinical Pathology Screening Guidelines for the Prevention and Early Detection of Cervical Cancer 2012 Consensus Statement  - She is comfortable with the plan and had her questions answered.  - Return in about 1 year (around 05/04/2019) for annual/pap.   [name], MD, [facility], [place]

## 2018-05-08 LAB — CYTOLOGY - PAP: HPV (WINDOPATH): DETECTED — AB

## 2018-05-27 ENCOUNTER — Telehealth: Payer: Self-pay | Admitting: Obstetrics and Gynecology

## 2018-05-27 NOTE — Telephone Encounter (Signed)
Patient is calling for results. Patient asks for the returned "call at 2 pm due to her job". Thank you

## 2018-05-27 NOTE — Telephone Encounter (Signed)
Abnormal pap, please call patient upon returning to office.

## 2018-05-31 ENCOUNTER — Telehealth: Payer: Self-pay | Admitting: Obstetrics and Gynecology

## 2018-05-31 NOTE — Telephone Encounter (Signed)
Called and left voice mail for patient to call back to be schedule °

## 2018-05-31 NOTE — Telephone Encounter (Signed)
-----   Message from [name], MD sent at 05/29/2018  3:05 PM EST ----- Regarding: Colposcopy Needs colposcopy in the next 1-4 weeks

## 2018-06-20 ENCOUNTER — Other Ambulatory Visit (HOSPITAL_COMMUNITY)
Admission: RE | Admit: 2018-06-20 | Discharge: 2018-06-20 | Disposition: A | Discharge status: Home / Self Care | Payer: 59 | Source: Ambulatory Visit | Attending: Obstetrics and Gynecology | Admitting: Obstetrics and Gynecology

## 2018-06-20 ENCOUNTER — Other Ambulatory Visit: Payer: Self-pay

## 2018-06-20 ENCOUNTER — Encounter: Payer: Self-pay | Admitting: Obstetrics and Gynecology

## 2018-06-20 ENCOUNTER — Ambulatory Visit (INDEPENDENT_AMBULATORY_CARE_PROVIDER_SITE_OTHER): Payer: 59 | Admitting: Obstetrics and Gynecology

## 2018-06-20 VITALS — BP 138/82 | HR 62 | Wt 144.0 lb

## 2018-06-20 DIAGNOSIS — N870 Mild cervical dysplasia: Secondary | ICD-10-CM | POA: Diagnosis not present

## 2018-06-20 DIAGNOSIS — R87622 Low grade squamous intraepithelial lesion on cytologic smear of vagina (LGSIL): Secondary | ICD-10-CM | POA: Insufficient documentation

## 2018-06-20 DIAGNOSIS — R87811 Vaginal high risk human papillomavirus (HPV) DNA test positive: Secondary | ICD-10-CM

## 2018-06-20 NOTE — Progress Notes (Signed)
   GYNECOLOGY CLINIC COLPOSCOPY PROCEDURE NOTE  63 y.o. [id number] here for colposcopy for LGSIL HPV positive   pap smear on 05/03/2018. Discussed underlying role for HPV infection in the development of cervical dysplasia, its natural history and progression/regression, need for surveillance.  Pap treatment history: 05/03/2018 LGSIL pap HPV positive 11/01/2016 Colposcopy negative 10/03/2016 Pap LGSIL HPV positive 02/18/2015 Colposcopy VAIN 2 at 9 O'Clock biopsy 08/07/2014 Colposcopy VAIN 2 at 9 O'Clock biopsy 06/18/2014 Pap LGSIL  11/21/2013 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 at 12 to 3 O'Clock 10/09/2013 Colposcopy CIN II at 2 O;Clock, ECC HGSIL 09/14/2013 Pap LGSIL  Is the patient  pregnant: No LMP: No LMP recorded. Patient has had a hysterectomy. Smoking status:  reports that she has been smoking. She has never used smokeless tobacco. Contraception: status post hysterectomy Future fertility desired:  No  Patient given informed consent, signed copy in the chart, time out was performed.  The patient was position in dorsal lithotomy position. Speculum was placed the cervix was visualized.   After application of acetic acid colposcopic inspection of the cervix was undertaken.   Colposcopy adequate, full visualization of transformation zone: No no visible lesions; corresponding biopsies obtained at 9 O'Clock (site of prior VAIN II findings).   ECC specimen obtained:  No  All specimens were labeled and sent to pathology.   Patient was given post procedure instructions.  Will follow up pathology and manage accordingly.  Routine preventative health maintenance measures emphasized.  OBGyn Exam  [name], MD, [facility], [facility]

## 2018-08-19 DIAGNOSIS — I10 Essential (primary) hypertension: Secondary | ICD-10-CM | POA: Diagnosis not present

## 2018-08-19 DIAGNOSIS — R3129 Other microscopic hematuria: Secondary | ICD-10-CM | POA: Diagnosis not present

## 2018-08-19 DIAGNOSIS — M8949 Other hypertrophic osteoarthropathy, multiple sites: Secondary | ICD-10-CM | POA: Diagnosis not present

## 2018-08-19 DIAGNOSIS — Z1159 Encounter for screening for other viral diseases: Secondary | ICD-10-CM | POA: Diagnosis not present

## 2018-08-19 DIAGNOSIS — E7800 Pure hypercholesterolemia, unspecified: Secondary | ICD-10-CM | POA: Diagnosis not present

## 2018-08-20 ENCOUNTER — Telehealth: Payer: Self-pay | Admitting: *Deleted

## 2018-08-20 NOTE — Telephone Encounter (Signed)
Received referral for low dose lung cancer screening CT scan. Message left at phone number listed in EMR for patient to call me back to facilitate scheduling scan.  

## 2018-08-27 ENCOUNTER — Other Ambulatory Visit: Payer: Self-pay | Admitting: Internal Medicine

## 2018-08-27 DIAGNOSIS — Z1231 Encounter for screening mammogram for malignant neoplasm of breast: Secondary | ICD-10-CM

## 2018-09-09 ENCOUNTER — Encounter: Payer: Self-pay | Admitting: *Deleted

## 2018-09-09 ENCOUNTER — Telehealth: Payer: Self-pay | Admitting: *Deleted

## 2018-09-09 DIAGNOSIS — Z122 Encounter for screening for malignant neoplasm of respiratory organs: Secondary | ICD-10-CM

## 2018-09-09 DIAGNOSIS — Z87891 Personal history of nicotine dependence: Secondary | ICD-10-CM

## 2018-09-09 NOTE — Telephone Encounter (Signed)
Received referral for initial lung cancer screening scan. Contacted patient and obtained smoking history,(current,34.5 pack year) as well as answering questions related to screening process. Patient denies signs of lung cancer such as weight loss or hemoptysis. Patient denies comorbidity that would prevent curative treatment if lung cancer were found. Patient is scheduled for shared decision making visit and CT scan on 09/12/18 at 330pm.

## 2018-09-12 ENCOUNTER — Inpatient Hospital Stay: Payer: 59 | Attending: Nurse Practitioner | Admitting: Hospice and Palliative Medicine

## 2018-09-12 ENCOUNTER — Other Ambulatory Visit: Payer: Self-pay

## 2018-09-12 ENCOUNTER — Ambulatory Visit
Admission: RE | Admit: 2018-09-12 | Discharge: 2018-09-12 | Disposition: A | Discharge status: Home / Self Care | Payer: 59 | Source: Ambulatory Visit | Attending: Nurse Practitioner | Admitting: Nurse Practitioner

## 2018-09-12 DIAGNOSIS — Z87891 Personal history of nicotine dependence: Secondary | ICD-10-CM

## 2018-09-12 DIAGNOSIS — Z122 Encounter for screening for malignant neoplasm of respiratory organs: Secondary | ICD-10-CM | POA: Insufficient documentation

## 2018-09-12 NOTE — Progress Notes (Signed)
In accordance with CMS guidelines, patient has met eligibility criteria including age, absence of signs or symptoms of lung cancer.  Social History   Tobacco Use  . Smoking status: Current Every Day Smoker    Packs/day: 0.75    Years: 46.00    Pack years: 34.50    Types: Cigarettes  . Smokeless tobacco: Never Used  Substance Use Topics  . Alcohol use: No  . Drug use: No      A shared decision-making session was conducted prior to the performance of CT scan. This includes one or more decision aids, includes benefits and harms of screening, follow-up diagnostic testing, over-diagnosis, false positive rate, and total radiation exposure.   Counseling on the importance of adherence to annual lung cancer LDCT screening, impact of co-morbidities, and ability or willingness to undergo diagnosis and treatment is imperative for compliance of the program.   Counseling on the importance of continued smoking cessation for former smokers; the importance of smoking cessation for current smokers, and information about tobacco cessation interventions have been given to patient including [place] and [employer] quit [place] programs.   Written order for lung cancer screening with LDCT has been given to the patient and any and all questions have been answered to the best of my abilities.    Yearly follow up will be coordinated by Burgess Estelle, Thoracic Navigator.  Time Total: 15 minutes  Visit consisted of counseling and education dealing with complex health screening. Greater than 50%  of this time was spent counseling and coordinating care related to the above assessment and plan.  Signed by: Altha Harm, PhD, NP-C [phone] (Work Cell)

## 2018-09-18 ENCOUNTER — Encounter: Payer: Self-pay | Admitting: *Deleted

## 2018-10-07 ENCOUNTER — Ambulatory Visit
Admission: RE | Admit: 2018-10-07 | Discharge: 2018-10-07 | Disposition: A | Discharge status: Home / Self Care | Payer: 59 | Source: Ambulatory Visit | Attending: Internal Medicine | Admitting: Internal Medicine

## 2018-10-07 ENCOUNTER — Other Ambulatory Visit: Payer: Self-pay

## 2018-10-07 DIAGNOSIS — Z1231 Encounter for screening mammogram for malignant neoplasm of breast: Secondary | ICD-10-CM

## 2018-10-10 ENCOUNTER — Other Ambulatory Visit: Payer: Self-pay | Admitting: Internal Medicine

## 2018-10-10 DIAGNOSIS — R928 Other abnormal and inconclusive findings on diagnostic imaging of breast: Secondary | ICD-10-CM

## 2018-10-18 ENCOUNTER — Ambulatory Visit: Payer: 59

## 2018-10-18 ENCOUNTER — Other Ambulatory Visit: Payer: 59

## 2018-10-24 ENCOUNTER — Other Ambulatory Visit: Payer: Self-pay

## 2018-10-24 ENCOUNTER — Ambulatory Visit
Admission: RE | Admit: 2018-10-24 | Discharge: 2018-10-24 | Disposition: A | Discharge status: Home / Self Care | Payer: 59 | Source: Ambulatory Visit | Attending: Internal Medicine | Admitting: Internal Medicine

## 2018-10-24 DIAGNOSIS — R928 Other abnormal and inconclusive findings on diagnostic imaging of breast: Secondary | ICD-10-CM | POA: Diagnosis present

## 2019-05-13 ENCOUNTER — Other Ambulatory Visit: Payer: Self-pay | Admitting: Internal Medicine

## 2019-05-13 DIAGNOSIS — R3121 Asymptomatic microscopic hematuria: Secondary | ICD-10-CM

## 2019-05-27 ENCOUNTER — Other Ambulatory Visit: Payer: Self-pay

## 2019-05-27 ENCOUNTER — Ambulatory Visit
Admission: RE | Admit: 2019-05-27 | Discharge: 2019-05-27 | Disposition: A | Discharge status: Home / Self Care | Payer: 59 | Source: Ambulatory Visit | Attending: Internal Medicine | Admitting: Internal Medicine

## 2019-05-27 DIAGNOSIS — R3121 Asymptomatic microscopic hematuria: Secondary | ICD-10-CM | POA: Diagnosis present

## 2019-05-27 MED ORDER — IOHEXOL 300 MG/ML  SOLN
125.0000 mL | Freq: Once | INTRAMUSCULAR | Status: AC | PRN
  Administered 2019-05-27: 125 mL via INTRAVENOUS

## 2019-06-23 ENCOUNTER — Ambulatory Visit: Payer: 59 | Admitting: Obstetrics and Gynecology

## 2019-06-25 ENCOUNTER — Ambulatory Visit: Payer: 59

## 2019-06-26 ENCOUNTER — Ambulatory Visit: Payer: 59 | Attending: Internal Medicine

## 2019-06-26 DIAGNOSIS — Z23 Encounter for immunization: Secondary | ICD-10-CM

## 2019-06-26 NOTE — Progress Notes (Signed)
   Covid-19 Vaccination Clinic  Name:  Kathleen Riddle    MRN: [id number] DOB: 12-20-1955  06/26/2019  Kathleen Riddle was observed post Covid-19 immunization for 15 minutes without incident. She was provided with Vaccine Information Sheet and instruction to access the V-Safe system.   Kathleen Riddle was instructed to call 911 with any severe reactions post vaccine: Marland Kitchen Difficulty breathing  . Swelling of face and throat  . A fast heartbeat  . A bad rash all over body  . Dizziness and weakness   Immunizations Administered    Name Date Dose VIS Date Route   Pfizer COVID-19 Vaccine 06/26/2019 11:29 AM 0.3 mL 03/21/2019 Intramuscular   Manufacturer: [place]   Lot: [id number]   [place]: [id number]

## 2019-06-30 ENCOUNTER — Other Ambulatory Visit: Payer: Self-pay

## 2019-07-02 ENCOUNTER — Ambulatory Visit: Payer: 59

## 2019-07-23 ENCOUNTER — Ambulatory Visit: Payer: 59 | Attending: Internal Medicine

## 2019-07-23 DIAGNOSIS — Z23 Encounter for immunization: Secondary | ICD-10-CM

## 2019-07-23 NOTE — Progress Notes (Signed)
   Covid-19 Vaccination Clinic  Name:  Kathleen Riddle    MRN: [id number] DOB: [birth year]  07/23/2019  [name] was observed post Covid-19 immunization for 15 minutes without incident. She was provided with Vaccine Information Sheet and instruction to access the V-Safe system.   [name] was instructed to call 911 with any severe reactions post vaccine: Marland Kitchen Difficulty breathing  . Swelling of face and throat  . A fast heartbeat  . A bad rash all over body  . Dizziness and weakness   Immunizations Administered    Name Date Dose VIS Date Route   Pfizer COVID-19 Vaccine 07/23/2019 10:38 AM 0.3 mL 03/21/2019 Intramuscular   Manufacturer: [employer]   Lot: [id number]   [place]: [id number]

## 2019-07-30 ENCOUNTER — Ambulatory Visit: Payer: 59

## 2019-08-12 ENCOUNTER — Encounter: Payer: Self-pay | Admitting: Obstetrics and Gynecology

## 2019-08-12 ENCOUNTER — Other Ambulatory Visit (HOSPITAL_COMMUNITY)
Admission: RE | Admit: 2019-08-12 | Discharge: 2019-08-12 | Disposition: A | Discharge status: Home / Self Care | Payer: 59 | Source: Ambulatory Visit | Attending: Obstetrics and Gynecology | Admitting: Obstetrics and Gynecology

## 2019-08-12 ENCOUNTER — Ambulatory Visit (INDEPENDENT_AMBULATORY_CARE_PROVIDER_SITE_OTHER): Payer: 59 | Admitting: Obstetrics and Gynecology

## 2019-08-12 ENCOUNTER — Other Ambulatory Visit: Payer: Self-pay

## 2019-08-12 VITALS — BP 138/74 | Wt 158.0 lb

## 2019-08-12 DIAGNOSIS — Z124 Encounter for screening for malignant neoplasm of cervix: Secondary | ICD-10-CM

## 2019-08-12 DIAGNOSIS — N891 Moderate vaginal dysplasia: Secondary | ICD-10-CM

## 2019-08-12 NOTE — Progress Notes (Signed)
Obstetrics & Gynecology Office Visit   Chief Complaint:  Chief Complaint  Patient presents with  . Follow-up    Repeat pap smear    History of Present Illness:Kathleen Riddle is a 64 y.o. woman who presents today for continued surveillance for history of dysplasia. Last pap obtained on 05/03/2018 revealed LGSIL HPV positive.  Prior colposcopy 06/20/2018 CIN I  Pap/Treatment History: See VAIN II under active problems below for treatment history  Review of Systems: Review of Systems  Constitutional: Negative.   Gastrointestinal: Negative.   Genitourinary: Negative.      Past Medical History:  Patient Active Problem List   Diagnosis Date Noted  . VAIN II (vaginal intraepithelial neoplasia grade II) 10/03/2016    06/20/2018 Colposcopy VAIN I at 9 O'Clock 05/03/2018 LGSIL pap HPV positive 11/01/2016 Colposcopy negative 10/03/2016 Pap LGSIL HPV positive 02/18/2015 Colposcopy VAIN 2 at 9 O'Clock biopsy 08/07/2014 Colposcopy VAIN 2 at 9 O'Clock biopsy 06/18/2014 Pap LGSIL  11/21/2013 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 at 12 to 3 O'Clock 10/09/2013 Colposcopy CIN II at 2 O;Clock, ECC HGSIL 09/14/2013 Pap LGSIL      Past Surgical History:  Patient Active Problem List   Diagnosis Date Noted  . VAIN II (vaginal intraepithelial neoplasia grade II) 10/03/2016    06/20/2018 Colposcopy VAIN I at 9 O'Clock 05/03/2018 LGSIL pap HPV positive 11/01/2016 Colposcopy negative 10/03/2016 Pap LGSIL HPV positive 02/18/2015 Colposcopy VAIN 2 at 9 O'Clock biopsy 08/07/2014 Colposcopy VAIN 2 at 9 O'Clock biopsy 06/18/2014 Pap LGSIL  11/21/2013 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 at 12 to 3 O'Clock 10/09/2013 Colposcopy CIN II at 2 O;Clock, ECC HGSIL 09/14/2013 Pap LGSIL      Gynecologic History: No LMP recorded. Patient has had a hysterectomy.  Obstetric History: [id number]  Family History:  Family History  Problem Relation Age of Onset  . Breast cancer Neg Hx   . Bladder Cancer Neg Hx   .  Kidney cancer Neg Hx     Social History:  Social History   Socioeconomic History  . Marital status: Divorced    Spouse name: Not on file  . Number of children: Not on file  . Years of education: Not on file  . Highest education level: Not on file  Occupational History  . Not on file  Tobacco Use  . Smoking status: Current Every Day Smoker    Packs/day: 0.75    Years: 46.00    Pack years: 34.50    Types: Cigarettes  . Smokeless tobacco: Never Used  Substance and Sexual Activity  . Alcohol use: No  . Drug use: No  . Sexual activity: Yes  Other Topics Concern  . Not on file  Social History Narrative  . Not on file   Social Determinants of Health   Financial Resource Strain:   . Difficulty of Paying Living Expenses:   Food Insecurity:   . Worried About Charity fundraiser in the Last Year:   . Arboriculturist in the Last Year:   Transportation Needs:   . Film/video editor (Medical):   Marland Kitchen Lack of Transportation (Non-Medical):   Physical Activity:   . Days of Exercise per Week:   . Minutes of Exercise per Session:   Stress:   . Feeling of Stress :   Social Connections:   . Frequency of Communication with Friends and Family:   . Frequency of Social Gatherings with Friends and Family:   .  Attends Religious Services:   . Active Member of Clubs or Organizations:   . Attends Archivist Meetings:   Marland Kitchen Marital Status:   Intimate Partner Violence:   . Fear of Current or Ex-Partner:   . Emotionally Abused:   Marland Kitchen Physically Abused:   . Sexually Abused:     Allergies:  No Known Allergies  Medications: Prior to Admission medications   Medication Sig Start Date End Date Taking? Authorizing Provider  amLODipine (NORVASC) 2.5 MG tablet Take by mouth. 05/05/16  Yes [provider]  aspirin 81 MG EC tablet Take by mouth.   Yes [provider]  atorvastatin (LIPITOR) 40 MG tablet Take 40 mg by mouth daily. 08/02/19  Yes [provider]    diclofenac sodium (VOLTAREN) 1 % GEL Apply topically 4 (four) times daily.   Yes [provider]  meloxicam (MOBIC) 7.5 MG tablet 1-2 tablets as needed for pain. 01/19/15  Yes [provider]    Physical Exam Vitals:  Vitals:   08/12/19 1435  BP: 138/74   No LMP recorded. Patient has had a hysterectomy.  General: NAD, well nourished, appears stated age 58: normocephalic, anicteric Pulmonary: No increased work of breathing Genitourinary:  External: Normal external female genitalia.  Normal urethral meatus, normal  Bartholin's and Skene's glands.    Vagina: Normal vaginal mucosa, no evidence of prolapse.    Cervix: surgically absent  Uterus: surgically absent  Adnexa: ovaries non-enlarged, no adnexal masses  Rectal: deferred  Lymphatic: no evidence of inguinal lymphadenopathy Extremities: no edema, erythema, or tenderness Neurologic: Grossly intact Psychiatric: mood appropriate, affect full  Female chaperone present for pelvic and breast  portions of the physical exam  Assessment: 64 y.o. G3P3003 follow up for follow up pain for history of VAIN II  Plan: Problem List Items Addressed This Visit      Genitourinary   VAIN II (vaginal intraepithelial neoplasia grade II)    Other Visit Diagnoses    Screening for malignant neoplasm of cervix    -  Primary   Relevant Orders   Cytology - PAP      - Follow up pap smear from today.   - I had a lengthly discussion with Kathleen Riddle  regarding the cause of dysplasia of the lower genital tract (including immunosuppression in the setting of HPV exposure and tobacco exposure). I explained the potential for progression to invasive malignancy, the recurrent nature of these lesions (and the need for close continued followup). Results of today's pap will dictate need for further evaluation and follow up per ASCCP guidelines..  - She is comfortable with the plan and had her questions answered.  - Return in about 1  year (around 08/11/2020) for pap.   [name], MD, [facility], [facility] 08/12/2019, 2:49 PM

## 2019-08-20 LAB — CYTOLOGY - PAP
Comment: NEGATIVE
Comment: NEGATIVE
HPV 16: NEGATIVE
HPV 18 / 45: NEGATIVE
High risk HPV: POSITIVE — AB

## 2019-08-27 ENCOUNTER — Telehealth: Payer: Self-pay

## 2019-08-27 DIAGNOSIS — Z122 Encounter for screening for malignant neoplasm of respiratory organs: Secondary | ICD-10-CM

## 2019-08-27 DIAGNOSIS — Z87891 Personal history of nicotine dependence: Secondary | ICD-10-CM

## 2019-08-27 NOTE — Telephone Encounter (Signed)
Patient has been notified that the low dose lung cancer screening CT scan is due currently or will be in near future.  Confirmed that patient is within the appropriate age range and asymptomatic, (no signs or symptoms of lung cancer).  Patient denies illness that would prevent curative treatment for lung cancer if found.  Patient is agreeable for CT scan being scheduled.   Verified smoking history (current smoker, 47 with 0.5 ppd year history).    CT scan scheduled for 09/18/19 @ 8:00

## 2019-08-28 NOTE — Telephone Encounter (Signed)
Smoking history: current, 35 pack year

## 2019-08-28 NOTE — Addendum Note (Signed)
Addended by: Lieutenant Diego on: 08/28/2019 02:34 PM   Modules accepted: Orders

## 2019-09-04 ENCOUNTER — Telehealth: Payer: Self-pay | Admitting: Obstetrics and Gynecology

## 2019-09-04 NOTE — Telephone Encounter (Signed)
-----   Message from [name], MD sent at 09/04/2019  8:33 AM EDT ----- Needs colposcopy in the next 2-8 weeks

## 2019-09-04 NOTE — Progress Notes (Signed)
Needs colposcopy in the next 2-8 weeks

## 2019-09-04 NOTE — Telephone Encounter (Signed)
Called to schedule appointment for Colposcopy, while on the phone with patient the call was drop. Will attempt to reach out to patient at a later time

## 2019-09-05 NOTE — Telephone Encounter (Signed)
Patient is schedule for 10/20/19 with AMS

## 2019-09-18 ENCOUNTER — Ambulatory Visit
Admission: RE | Admit: 2019-09-18 | Discharge: 2019-09-18 | Disposition: A | Discharge status: Home / Self Care | Payer: 59 | Source: Ambulatory Visit | Attending: Oncology | Admitting: Oncology

## 2019-09-18 ENCOUNTER — Other Ambulatory Visit: Payer: Self-pay

## 2019-09-18 DIAGNOSIS — Z122 Encounter for screening for malignant neoplasm of respiratory organs: Secondary | ICD-10-CM | POA: Insufficient documentation

## 2019-09-18 DIAGNOSIS — Z87891 Personal history of nicotine dependence: Secondary | ICD-10-CM | POA: Diagnosis present

## 2019-09-23 ENCOUNTER — Encounter: Payer: Self-pay | Admitting: *Deleted

## 2019-09-26 ENCOUNTER — Other Ambulatory Visit: Payer: Self-pay | Admitting: Internal Medicine

## 2019-09-26 DIAGNOSIS — Z1231 Encounter for screening mammogram for malignant neoplasm of breast: Secondary | ICD-10-CM

## 2019-10-09 ENCOUNTER — Ambulatory Visit
Admission: RE | Admit: 2019-10-09 | Discharge: 2019-10-09 | Disposition: A | Discharge status: Home / Self Care | Payer: 59 | Source: Ambulatory Visit | Attending: Internal Medicine | Admitting: Internal Medicine

## 2019-10-09 DIAGNOSIS — Z1231 Encounter for screening mammogram for malignant neoplasm of breast: Secondary | ICD-10-CM | POA: Diagnosis present

## 2019-10-20 ENCOUNTER — Other Ambulatory Visit (HOSPITAL_COMMUNITY)
Admission: RE | Admit: 2019-10-20 | Discharge: 2019-10-20 | Disposition: A | Discharge status: Home / Self Care | Payer: 59 | Source: Ambulatory Visit | Attending: Obstetrics and Gynecology | Admitting: Obstetrics and Gynecology

## 2019-10-20 ENCOUNTER — Other Ambulatory Visit: Payer: Self-pay

## 2019-10-20 ENCOUNTER — Encounter: Payer: Self-pay | Admitting: Obstetrics and Gynecology

## 2019-10-20 ENCOUNTER — Ambulatory Visit (INDEPENDENT_AMBULATORY_CARE_PROVIDER_SITE_OTHER): Payer: 59 | Admitting: Obstetrics and Gynecology

## 2019-10-20 VITALS — BP 120/70 | HR 78 | Resp 18 | Ht 62.0 in | Wt 159.6 lb

## 2019-10-20 DIAGNOSIS — R87622 Low grade squamous intraepithelial lesion on cytologic smear of vagina (LGSIL): Secondary | ICD-10-CM

## 2019-10-20 DIAGNOSIS — N891 Moderate vaginal dysplasia: Secondary | ICD-10-CM | POA: Diagnosis not present

## 2019-10-20 DIAGNOSIS — R87821 Vaginal low risk human papillomavirus (HPV) DNA test positive: Secondary | ICD-10-CM | POA: Diagnosis present

## 2019-10-20 NOTE — Progress Notes (Signed)
   GYNECOLOGY CLINIC COLPOSCOPY PROCEDURE NOTE  64 y.o. [id number] here for colposcopy for low-grade squamous intraepithelial neoplasia (LGSIL - encompassing HPV,mild dysplasia,CIN I) HPV positive pap smear on 08/12/2019. Discussed underlying role for HPV infection in the development of cervical dysplasia, its natural history and progression/regression, need for surveillance.  Pap history 08/12/2019 LGSIL HPV positive (16/18 negative) 06/20/2018 Colposcopy VAIN I at 9 O'Clock 05/03/2018 LGSIL pap HPV positive 11/01/2016 Colposcopy negative 10/03/2016 Pap LGSIL HPV positive 02/18/2015 Colposcopy VAIN 2 at 9 O'Clock biopsy 08/07/2014 Colposcopy VAIN 2 at 9 O'Clock biopsy 06/18/2014 Pap LGSIL  11/21/2013 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 at 12 to 3 O'Clock 10/09/2013 Colposcopy CIN II at 2 O;Clock, ECC HGSIL 09/14/2013 Pap LGSIL  Is the patient  pregnant: No LMP: No LMP recorded. Patient has had a hysterectomy. Smoking status:  reports that she has been smoking cigarettes. She has a 34.50 pack-year smoking history. She has never used smokeless tobacco.   Patient given informed consent, signed copy in the chart, time out was performed.  The patient was position in dorsal lithotomy position. Speculum was placed the cervix was visualized.   After application of acetic acid colposcopic inspection of the cervix was undertaken.   Colposcopy adequate, full visualization of transformation zone: N/A There were no visible changes, a representative biopsy was taken from the central vaginal cuff ECC specimen obtained:  N/A All specimens were labeled and sent to pathology.   Patient was given post procedure instructions.  Will follow up pathology and manage accordingly.  Routine preventative health maintenance measures emphasized.  OBGyn Exam  [name], MD, [facility], [facility]

## 2019-10-21 LAB — SURGICAL PATHOLOGY

## 2019-11-02 IMAGING — CT CT CHEST LUNG CANCER SCREENING LOW DOSE
2 of 5 series · 15 of 40 positions shown, 18 images · non-contrast
Comparison: No priors.

CLINICAL DATA: 62-year-old female current smoker with 35 pack-year
history of smoking. Lung cancer screening examination.

EXAM:
CT CHEST WITHOUT CONTRAST LOW-DOSE FOR LUNG CANCER SCREENING
TECHNIQUE: Multidetector CT imaging of the chest was performed following the
standard protocol without IV contrast.

[Series 3: lung · axial · 0.54mm/px · z∈[-1137,-867]mm · 12 of 301 slices shown, 15 images (1 of 2)]
[im 16/301  mediastinal]
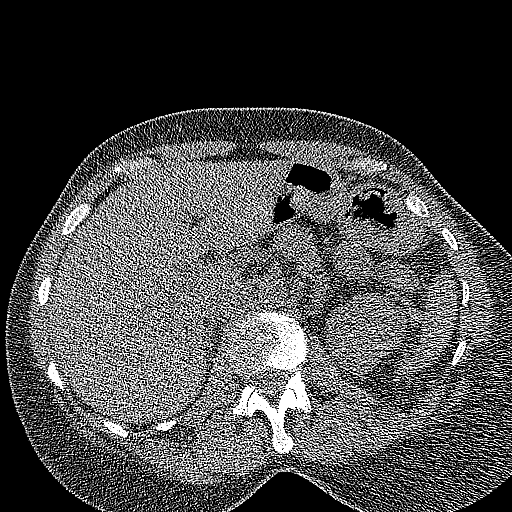
[im 16/301  lung]
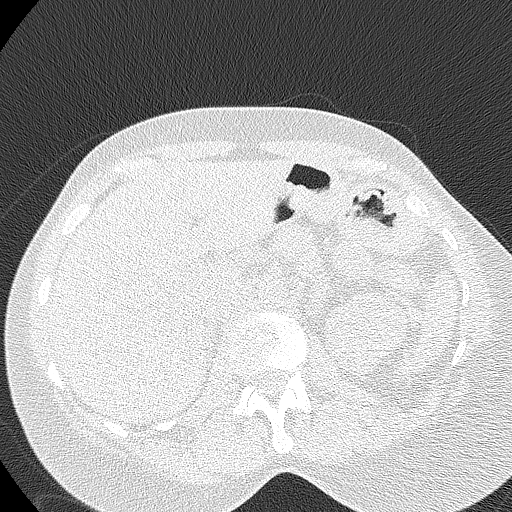
[im 46/301  lung]
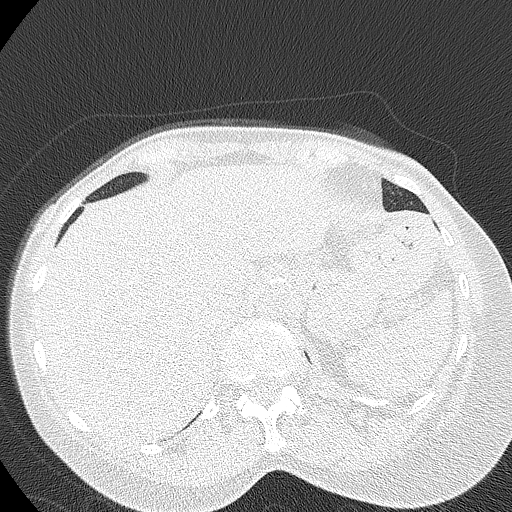
[im 61/301  lung]
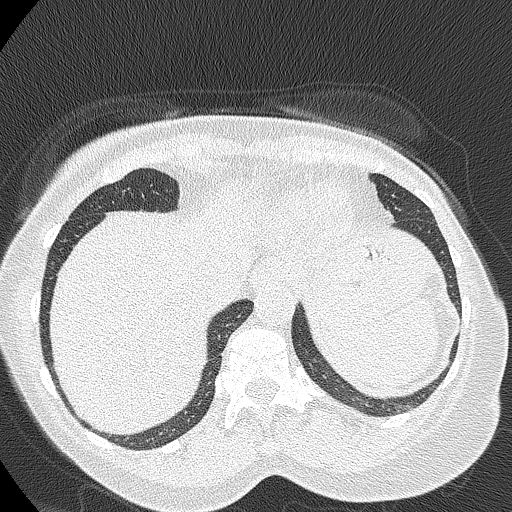
[im 91/301  lung]
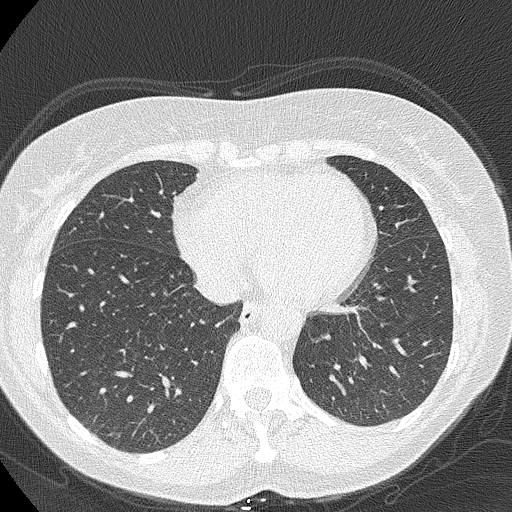
[im 121/301  mediastinal]
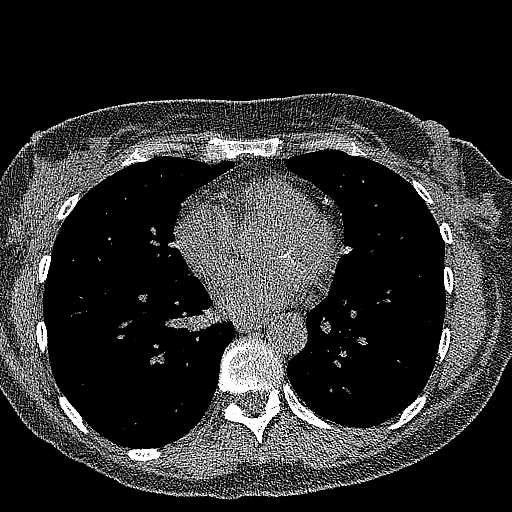
[im 121/301  lung]
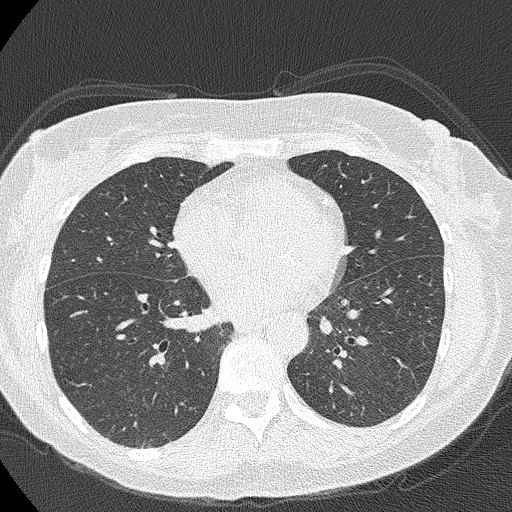
[im 136/301  lung]
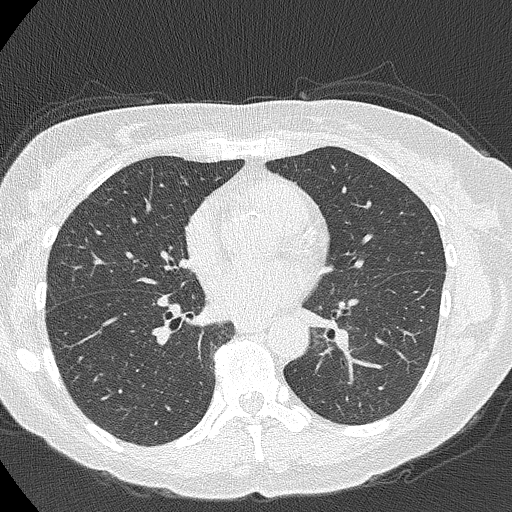
[im 166/301  lung]
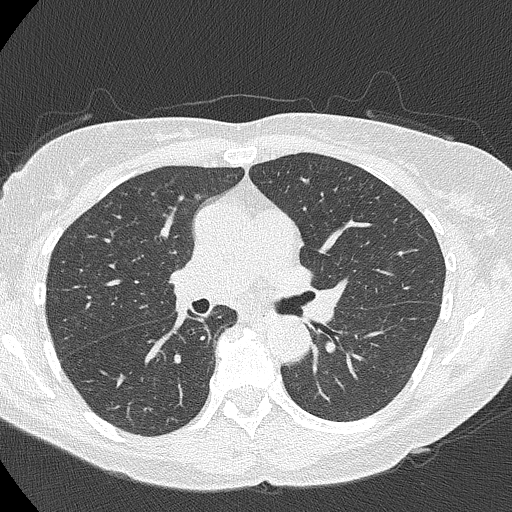
[im 181/301  lung]
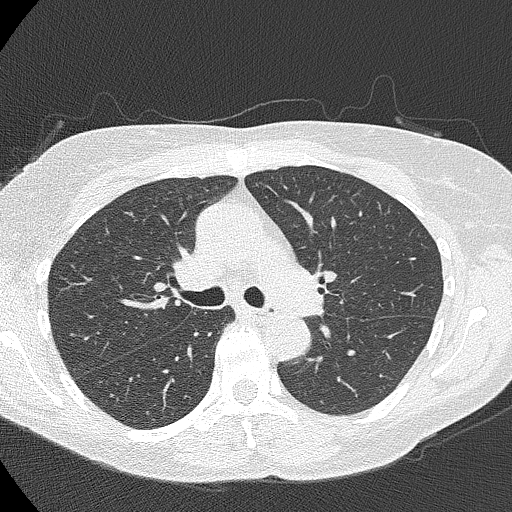
[im 211/301  mediastinal]
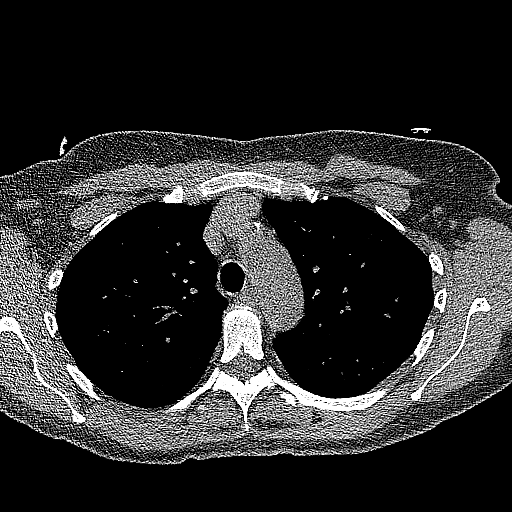
[im 211/301  lung]
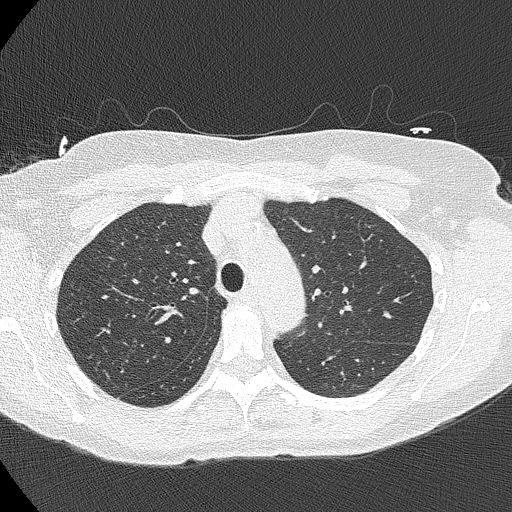
[im 241/301  lung]
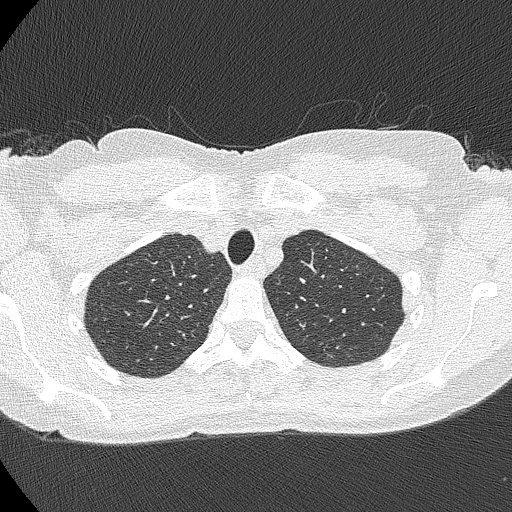
[im 256/301  lung]
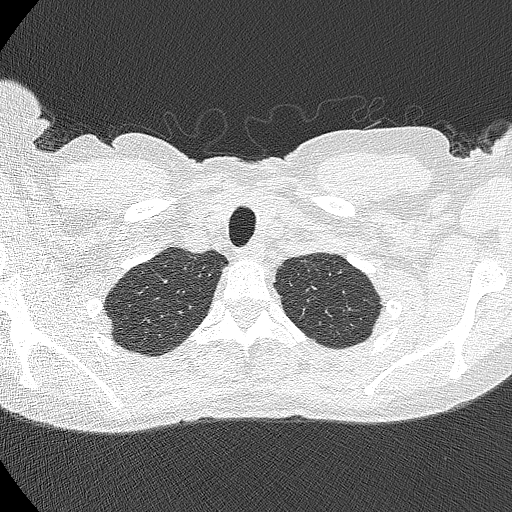
[im 286/301  lung]
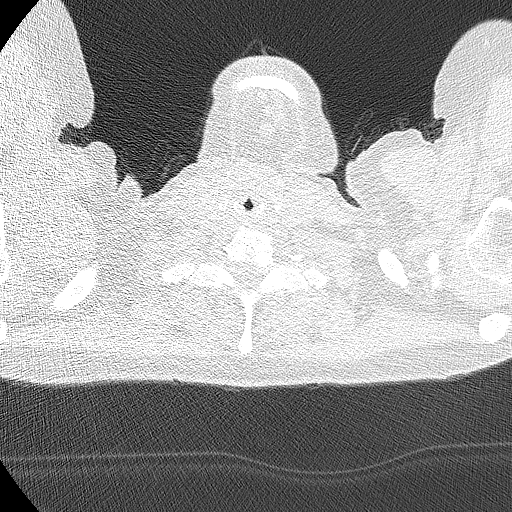

[Series 4: lung · coronal · 0.54mm/px · 3 of 261 slices shown (2 of 2)]
[im 53/261  lung]
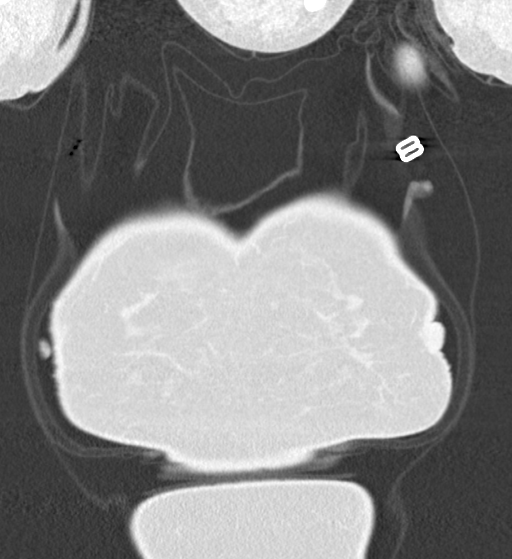
[im 105/261  lung]
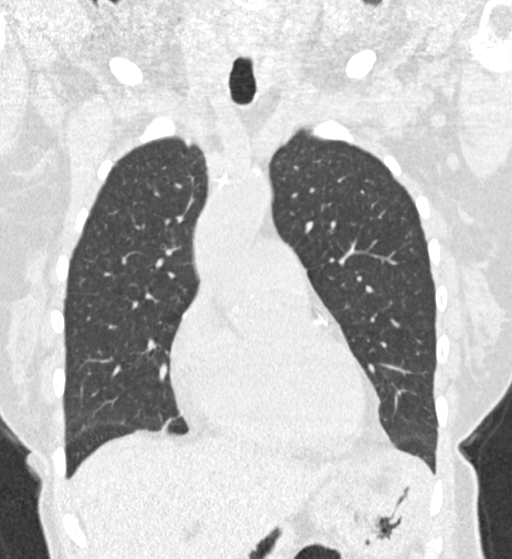
[im 157/261  lung]
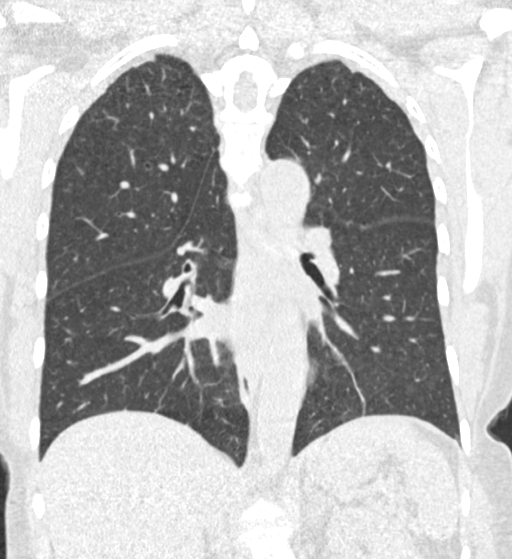

[15 of 40 positions shown; findings below may reference images not displayed]

FINDINGS: Cardiovascular: Heart size is normal. There is no significant
pericardial fluid, thickening or pericardial calcification. There is
aortic atherosclerosis, as well as atherosclerosis of the great
vessels of the mediastinum and the coronary arteries, including
calcified atherosclerotic plaque in the left main, left anterior
descending and right coronary arteries.

Mediastinum/Nodes: No pathologically enlarged mediastinal or hilar
lymph nodes. Please note that accurate exclusion of hilar adenopathy
is limited on noncontrast CT scans. Esophagus is unremarkable in
appearance. No axillary lymphadenopathy.

Lungs/Pleura: Tiny pulmonary nodules in the lungs bilaterally,
largest of which is in the medial aspect of the left upper lobe near
the apex (axial image 40 of series 3), with a volume derived mean
diameter 5.5 mm. No larger more suspicious appearing pulmonary
nodules or masses are noted. No acute consolidative airspace
disease. No pleural effusions. Mild diffuse bronchial wall
thickening with mild centrilobular and paraseptal emphysema.

Upper Abdomen: Aortic atherosclerosis.



Aortic Atherosclerosis (SM4YE-CU7.7) and Emphysema (SM4YE-849.6).

## 2019-11-03 ENCOUNTER — Other Ambulatory Visit: Payer: Self-pay

## 2019-11-03 ENCOUNTER — Other Ambulatory Visit
Admission: RE | Admit: 2019-11-03 | Discharge: 2019-11-03 | Disposition: A | Discharge status: Home / Self Care | Payer: 59 | Source: Ambulatory Visit | Attending: Internal Medicine | Admitting: Internal Medicine

## 2019-11-03 DIAGNOSIS — Z20822 Contact with and (suspected) exposure to covid-19: Secondary | ICD-10-CM | POA: Diagnosis not present

## 2019-11-03 DIAGNOSIS — Z01812 Encounter for preprocedural laboratory examination: Secondary | ICD-10-CM | POA: Insufficient documentation

## 2019-11-04 ENCOUNTER — Encounter: Payer: Self-pay | Admitting: Internal Medicine

## 2019-11-04 LAB — SARS CORONAVIRUS 2 (TAT 6-24 HRS): SARS Coronavirus 2: NEGATIVE

## 2019-11-05 ENCOUNTER — Encounter: Payer: Self-pay | Admitting: Internal Medicine

## 2019-11-05 ENCOUNTER — Other Ambulatory Visit: Payer: Self-pay

## 2019-11-05 ENCOUNTER — Encounter
Admission: RE | Disposition: A | Discharge status: Home / Self Care | Payer: Self-pay | Source: Home / Self Care | Attending: Internal Medicine

## 2019-11-05 ENCOUNTER — Ambulatory Visit: Payer: 59 | Admitting: Anesthesiology

## 2019-11-05 ENCOUNTER — Ambulatory Visit
Admission: RE | Admit: 2019-11-05 | Discharge: 2019-11-05 | Disposition: A | Discharge status: Home / Self Care | Payer: 59 | Attending: Internal Medicine | Admitting: Internal Medicine

## 2019-11-05 DIAGNOSIS — Z1211 Encounter for screening for malignant neoplasm of colon: Secondary | ICD-10-CM | POA: Insufficient documentation

## 2019-11-05 DIAGNOSIS — E7800 Pure hypercholesterolemia, unspecified: Secondary | ICD-10-CM | POA: Insufficient documentation

## 2019-11-05 DIAGNOSIS — K2100 Gastro-esophageal reflux disease with esophagitis, without bleeding: Secondary | ICD-10-CM | POA: Diagnosis not present

## 2019-11-05 DIAGNOSIS — K449 Diaphragmatic hernia without obstruction or gangrene: Secondary | ICD-10-CM | POA: Insufficient documentation

## 2019-11-05 DIAGNOSIS — K5909 Other constipation: Secondary | ICD-10-CM | POA: Diagnosis not present

## 2019-11-05 DIAGNOSIS — K2950 Unspecified chronic gastritis without bleeding: Secondary | ICD-10-CM | POA: Diagnosis not present

## 2019-11-05 DIAGNOSIS — F17200 Nicotine dependence, unspecified, uncomplicated: Secondary | ICD-10-CM | POA: Insufficient documentation

## 2019-11-05 DIAGNOSIS — Q631 Lobulated, fused and horseshoe kidney: Secondary | ICD-10-CM | POA: Diagnosis not present

## 2019-11-05 DIAGNOSIS — Z7982 Long term (current) use of aspirin: Secondary | ICD-10-CM | POA: Insufficient documentation

## 2019-11-05 DIAGNOSIS — I10 Essential (primary) hypertension: Secondary | ICD-10-CM | POA: Diagnosis not present

## 2019-11-05 DIAGNOSIS — Z79899 Other long term (current) drug therapy: Secondary | ICD-10-CM | POA: Diagnosis not present

## 2019-11-05 DIAGNOSIS — I700 Atherosclerosis of aorta: Secondary | ICD-10-CM | POA: Diagnosis not present

## 2019-11-05 DIAGNOSIS — Z791 Long term (current) use of non-steroidal anti-inflammatories (NSAID): Secondary | ICD-10-CM | POA: Insufficient documentation

## 2019-11-05 DIAGNOSIS — K222 Esophageal obstruction: Secondary | ICD-10-CM | POA: Insufficient documentation

## 2019-11-05 DIAGNOSIS — B9681 Helicobacter pylori [H. pylori] as the cause of diseases classified elsewhere: Secondary | ICD-10-CM | POA: Insufficient documentation

## 2019-11-05 DIAGNOSIS — Z86018 Personal history of other benign neoplasm: Secondary | ICD-10-CM | POA: Diagnosis not present

## 2019-11-05 DIAGNOSIS — R1314 Dysphagia, pharyngoesophageal phase: Secondary | ICD-10-CM | POA: Diagnosis not present

## 2019-11-05 HISTORY — DX: Moderate vaginal dysplasia: N89.1

## 2019-11-05 HISTORY — DX: Benign neoplasm of unspecified adrenal gland: D35.00

## 2019-11-05 HISTORY — DX: Lobulated, fused and horseshoe kidney: Q63.1

## 2019-11-05 HISTORY — PX: ESOPHAGOGASTRODUODENOSCOPY (EGD) WITH PROPOFOL: SHX5813

## 2019-11-05 HISTORY — DX: Other microscopic hematuria: R31.29

## 2019-11-05 HISTORY — DX: Atherosclerosis of aorta: I70.0

## 2019-11-05 HISTORY — PX: COLONOSCOPY WITH PROPOFOL: SHX5780

## 2019-11-05 SURGERY — COLONOSCOPY WITH PROPOFOL
Anesthesia: General

## 2019-11-05 MED ORDER — MIDAZOLAM HCL 2 MG/2ML IJ SOLN
INTRAMUSCULAR | Status: DC | PRN
  Administered 2019-11-05: 2 mg via INTRAVENOUS

## 2019-11-05 MED ORDER — LIDOCAINE HCL (CARDIAC) PF 100 MG/5ML IV SOSY
PREFILLED_SYRINGE | INTRAVENOUS | Status: DC | PRN
  Administered 2019-11-05: 50 mg via INTRAVENOUS

## 2019-11-05 MED ORDER — PROPOFOL 500 MG/50ML IV EMUL
INTRAVENOUS | Status: DC | PRN
  Administered 2019-11-05: 150 ug/kg/min via INTRAVENOUS

## 2019-11-05 MED ORDER — MIDAZOLAM HCL 2 MG/2ML IJ SOLN
INTRAMUSCULAR | Status: AC
  Filled 2019-11-05: qty 2

## 2019-11-05 MED ORDER — LIDOCAINE HCL (PF) 2 % IJ SOLN
INTRAMUSCULAR | Status: AC
  Filled 2019-11-05: qty 5

## 2019-11-05 MED ORDER — SODIUM CHLORIDE 0.9 % IV SOLN
INTRAVENOUS | Status: DC
  Administered 2019-11-05: 1000 mL via INTRAVENOUS

## 2019-11-05 MED ORDER — PROPOFOL 500 MG/50ML IV EMUL
INTRAVENOUS | Status: AC
  Filled 2019-11-05: qty 50

## 2019-11-05 NOTE — Op Note (Signed)
[facility] Patient Name: Kathleen Riddle Procedure Date: 11/05/2019 1:05 PM MRN: [id number] Account #: [id number] Date of Birth: 09/03/1955 Admit Type: Outpatient Age: 64 Room: [facility] ENDO ROOM 3 Gender: Female Note Status: Finalized Procedure:             Colonoscopy Indications:           Screening for colorectal malignant neoplasm Providers:             Benay Pike. Tamesha Ellerbrock MD, MD Medicines:             Propofol per Anesthesia Complications:         No immediate complications. Procedure:             Pre-Anesthesia Assessment:                        - The risks and benefits of the procedure and the                         sedation options and risks were discussed with the                         patient. All questions were answered and informed                         consent was obtained.                        - Patient identification and proposed procedure were                         verified prior to the procedure by the nurse. The                         procedure was verified in the procedure room.                        - ASA Grade Assessment: III - A patient with severe                         systemic disease.                        - After reviewing the risks and benefits, the patient                         was deemed in satisfactory condition to undergo the                         procedure.                        After obtaining informed consent, the colonoscope was                         passed under direct vision. Throughout the procedure,                         the patient's blood pressure, pulse, and oxygen  saturations were monitored continuously. The                         Colonoscope was introduced through the anus and                         advanced to the the cecum, identified by appendiceal                         orifice and ileocecal valve. The colonoscopy was                         performed  without difficulty. The patient tolerated                         the procedure well. The quality of the bowel                         preparation was excellent. The ileocecal valve,                         appendiceal orifice, and rectum were photographed. Findings:      The perianal and digital rectal examinations were normal. Pertinent       negatives include normal sphincter tone and no palpable rectal lesions.      The entire examined colon appeared normal on direct and retroflexion       views. Impression:            - The entire examined colon is normal on direct and                         retroflexion views.                        - No specimens collected. Recommendation:        - Patient has a contact number available for                         emergencies. The signs and symptoms of potential                         delayed complications were discussed with the patient.                         Return to normal activities tomorrow. Written                         discharge instructions were provided to the patient.                        - Monitor results to esophageal dilation                        - Await pathology results from EGD, also performed                         today.                        - Resume previous  diet.                        - Continue present medications.                        - Repeat colonoscopy in 10 years for screening                         purposes.                        - Return to physician assistant in 6 weeks.                        - The findings and recommendations were discussed with                         the patient. Procedure Code(s):     --- Professional ---                        C5852, Colorectal cancer screening; colonoscopy on                         individual not meeting criteria for high risk Diagnosis Code(s):     --- Professional ---                        Z12.11, Encounter for screening for malignant neoplasm                          of colon CPT copyright 2019 American Medical Association. All rights reserved. The codes documented in this report are preliminary and upon coder review may  be revised to meet current compliance requirements. Efrain Sella MD, MD 11/05/2019 1:39:42 PM This report has been signed electronically. Number of Addenda: 0 Note Initiated On: 11/05/2019 1:05 PM Scope Withdrawal Time: 0 hours 6 minutes 33 seconds  Total Procedure Duration: 0 hours 10 minutes 21 seconds  Estimated Blood Loss:  Estimated blood loss: none.      [facility]

## 2019-11-05 NOTE — Anesthesia Postprocedure Evaluation (Signed)
Anesthesia Post Note  Patient: Kathleen Riddle  Procedure(s) Performed: COLONOSCOPY WITH PROPOFOL (N/A ) ESOPHAGOGASTRODUODENOSCOPY (EGD) WITH PROPOFOL (N/A )  Patient location during evaluation: Endoscopy Anesthesia Type: General Level of consciousness: awake and alert Pain management: pain level controlled Vital Signs Assessment: post-procedure vital signs reviewed and stable Respiratory status: spontaneous breathing, nonlabored ventilation, respiratory function stable and patient connected to nasal cannula oxygen Cardiovascular status: blood pressure returned to baseline and stable Postop Assessment: no apparent nausea or vomiting Anesthetic complications: no   No complications documented.   Last Vitals:  Vitals:   11/05/19 1350 11/05/19 1410  BP: 111/76 (!) 127/86  Pulse:    Resp:    Temp:    SpO2:      Last Pain:  Vitals:   11/05/19 1410  TempSrc:   PainSc: 0-No pain                 Martha Clan

## 2019-11-05 NOTE — H&P (Signed)
Outpatient short stay form Pre-procedure 11/05/2019 1:07 PM Kathleen Riddle Kathleen Riddle, M.D.  Primary Physician: Kathleen Riddle, M.D.  Reason for visit:  GERD, dysphagia, colon cancer screening  History of present illness:  63y/o female with hx of GERD taking PPI just as needed c/o intermittent dysphagia to solids, foods pass with water intake. No abdominal pain or bleeding. Has chronic constipation controlled with Dulcolax. Last colonoscopy "10 years ago" or so was "normal".     Current Facility-Administered Medications:  .  0.9 %  sodium chloride infusion, , Intravenous, Continuous, Locklear, Hilton Cork, MD, Last Rate: 20 mL/hr at 11/05/19 1306, Continued from Pre-op at 11/05/19 1306  Medications Prior to Admission  Medication Sig Dispense Refill Last Dose  . amLODipine (NORVASC) 2.5 MG tablet Take by mouth.   11/04/2019 at Unknown time  . aspirin 81 MG EC tablet Take by mouth.   11/04/2019 at Unknown time  . bisacodyl (DULCOLAX) 5 MG EC tablet Take 5 mg by mouth daily as needed for moderate constipation.     Marland Kitchen lactulose (CHRONULAC) 10 GM/15ML solution Take by mouth 3 (three) times daily.     Marland Kitchen triamcinolone cream (KENALOG) 0.1 % Apply 1 application topically 2 (two) times daily.     Marland Kitchen atorvastatin (LIPITOR) 40 MG tablet Take 40 mg by mouth daily.     . diclofenac sodium (VOLTAREN) 1 % GEL Apply topically 4 (four) times daily.     . meloxicam (MOBIC) 7.5 MG tablet 1-2 tablets as needed for pain.        No Known Allergies   Past Medical History:  Diagnosis Date  . Abnormal Pap smear of cervix   . Adrenal adenoma   . Aortic atherosclerosis ([place])   . HGSIL (high grade squamous intraepithelial lesion) on Pap smear of cervix   . Horseshoe kidney   . Hypercholesteremia   . Hypertension   . Microscopic hematuria   . VAIN II (vaginal intraepithelial neoplasia grade II)     Review of systems:  Otherwise negative.    Physical Exam  Gen: Alert, oriented. Appears stated age.  HEENT: [address]/AT.  PERRLA. Lungs: CTA, no wheezes. CV: RR nl S1, S2. Abd: soft, benign, no masses. BS+ Ext: No edema. Pulses 2+    Planned procedures: Proceed with EGD and   colonoscopy. The patient understands the nature of the planned procedure, indications, risks, alternatives and potential complications including but not limited to bleeding, infection, perforation, damage to internal organs and possible oversedation/side effects from anesthesia. The patient agrees and gives consent to proceed.  Please refer to procedure notes for findings, recommendations and patient disposition/instructions.     [name] Kathleen Riddle, M.D. Gastroenterology 11/05/2019  1:07 PM

## 2019-11-05 NOTE — Anesthesia Procedure Notes (Signed)
Performed by: Cook-Martin, Raffael Bugarin Pre-anesthesia Checklist: Patient identified, Emergency Drugs available, Suction available, Patient being monitored and Timeout performed Patient Re-evaluated:Patient Re-evaluated prior to induction Oxygen Delivery Method: Nasal cannula Preoxygenation: Pre-oxygenation with 100% oxygen Induction Type: IV induction Airway Equipment and Method: Bite block Placement Confirmation: positive ETCO2 and CO2 detector       

## 2019-11-05 NOTE — Transfer of Care (Signed)
Immediate Anesthesia Transfer of Care Note  Patient: Kathleen Riddle  Procedure(s) Performed: COLONOSCOPY WITH PROPOFOL (N/A ) ESOPHAGOGASTRODUODENOSCOPY (EGD) WITH PROPOFOL (N/A )  Patient Location: PACU  Anesthesia Type:General  Level of Consciousness: awake and sedated  Airway & Oxygen Therapy: Patient Spontanous Breathing and Patient connected to nasal cannula oxygen  Post-op Assessment: Report given to RN and Post -op Vital signs reviewed and stable  Post vital signs: Reviewed and stable  Last Vitals:  Vitals Value Taken Time  BP    Temp    Pulse    Resp    SpO2      Last Pain:  Vitals:   11/05/19 1215  TempSrc: Tympanic  PainSc: 0-No pain         Complications: No complications documented.

## 2019-11-05 NOTE — Op Note (Signed)
[facility] Gastroenterology Patient Name: Kathleen Riddle Procedure Date: 11/05/2019 1:06 PM MRN: [id number] Account #: [id number] Date of Birth: Feb 10, 1956 Admit Type: Outpatient Age: 64 Room: [facility] ENDO ROOM 3 Gender: Female Note Status: Finalized Procedure:             Upper GI endoscopy Indications:           Esophageal dysphagia, Esophageal reflux Providers:             Benay Pike. Caitlan Chauca MD, MD Medicines:             Propofol per Anesthesia Complications:         No immediate complications. Procedure:             Pre-Anesthesia Assessment:                        - The risks and benefits of the procedure and the                         sedation options and risks were discussed with the                         patient. All questions were answered and informed                         consent was obtained.                        - Patient identification and proposed procedure were                         verified prior to the procedure by the nurse. The                         procedure was verified in the procedure room.                        - ASA Grade Assessment: III - A patient with severe                         systemic disease.                        - After reviewing the risks and benefits, the patient                         was deemed in satisfactory condition to undergo the                         procedure.                        After obtaining informed consent, the endoscope was                         passed under direct vision. Throughout the procedure,                         the patient's blood pressure, pulse, and oxygen  saturations were monitored continuously. The Endoscope                         was introduced through the mouth, and advanced to the                         third part of duodenum. The upper GI endoscopy was                         accomplished without difficulty. The patient tolerated                          the procedure well. Findings:      Mucosal changes including ringed esophagus, feline appearance,       circumferential folds and white specks were found in the entire       esophagus. Biopsies were obtained from the proximal and distal esophagus       with cold forceps for histology of suspected eosinophilic esophagitis.      One benign-appearing, intrinsic moderate stenosis was found in the       distal esophagus. This stenosis measured 1.3 cm (inner diameter) x less       than one cm (in length). The stenosis was traversed. The scope was       withdrawn. Dilation was performed with a Maloney dilator with moderate       resistance at 54 Fr.      A 1 cm hiatal hernia was present.      Patchy moderate inflammation characterized by congestion (edema),       erosions and erythema was found in the gastric antrum. Biopsies were       taken with a cold forceps for Helicobacter pylori testing.      The examined duodenum was normal.      The exam was otherwise without abnormality. Impression:            - Esophageal mucosal changes suggestive of                         eosinophilic esophagitis. Biopsied.                        - Benign-appearing esophageal stenosis. Dilated.                        - 1 cm hiatal hernia.                        - Gastritis. Biopsied.                        - Normal examined duodenum.                        - The examination was otherwise normal. Recommendation:        - Await pathology results.                        - Monitor results to esophageal dilation                        - Proceed with colonoscopy Procedure Code(s):     ---  Professional ---                        [phone], Esophagogastroduodenoscopy, flexible,                         transoral; with biopsy, single or multiple                        43450, Dilation of esophagus, by unguided sound or                         bougie, single or multiple passes Diagnosis Code(s):     --- Professional ---                         K21.9, Gastro-esophageal reflux disease without                         esophagitis                        R13.14, Dysphagia, pharyngoesophageal phase                        K29.70, Gastritis, unspecified, without bleeding                        K44.9, Diaphragmatic hernia without obstruction or                         gangrene                        K22.2, Esophageal obstruction                        K22.8, Other specified diseases of esophagus CPT copyright 2019 American Medical Association. All rights reserved. The codes documented in this report are preliminary and upon coder review may  be revised to meet current compliance requirements. Efrain Sella MD, MD 11/05/2019 1:23:36 PM This report has been signed electronically. Number of Addenda: 0 Note Initiated On: 11/05/2019 1:06 PM Estimated Blood Loss:  Estimated blood loss: none.      [facility]

## 2019-11-05 NOTE — Anesthesia Preprocedure Evaluation (Signed)
Anesthesia Evaluation  Patient identified by MRN, date of birth, ID band Patient awake    Reviewed: Allergy & Precautions, H&P , NPO status , Patient's Chart, lab work & pertinent test results, reviewed documented beta blocker date and time   History of Anesthesia Complications Negative for: history of anesthetic complications  Airway Mallampati: I  TM Distance: >3 FB Neck ROM: full    Dental  (+) Dental Advidsory Given, Edentulous Upper, Edentulous Lower, Upper Dentures, Lower Dentures   Pulmonary neg shortness of breath, neg COPD, neg recent URI, Current Smoker,    Pulmonary exam normal breath sounds clear to auscultation       Cardiovascular Exercise Tolerance: Good hypertension, (-) angina(-) Past MI and (-) Cardiac Stents negative cardio ROS Normal cardiovascular exam(-) dysrhythmias (-) Valvular Problems/Murmurs Rhythm:regular Rate:Normal     Neuro/Psych negative neurological ROS  negative psych ROS   GI/Hepatic Neg liver ROS, GERD  ,  Endo/Other  negative endocrine ROS  Renal/GU Renal disease (horseshoe kidney)  negative genitourinary   Musculoskeletal   Abdominal   Peds  Hematology negative hematology ROS (+)   Anesthesia Other Findings Past Medical History: No date: Abnormal Pap smear of cervix No date: Adrenal adenoma No date: Aortic atherosclerosis (HCC) No date: 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 No date: Hypercholesteremia No date: Hypertension No date: Microscopic hematuria 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)   Reproductive/Obstetrics negative OB ROS                             Anesthesia Physical Anesthesia Plan  ASA: II  Anesthesia Plan: General   Post-op Pain Management:    Induction: Intravenous  PONV Risk Score and Plan: 2 and Propofol infusion and TIVA  Airway  Management Planned: Natural Airway and Nasal Cannula  Additional Equipment:   Intra-op Plan:   Post-operative Plan:   Informed Consent: I have reviewed the patients History and Physical, chart, labs and discussed the procedure including the risks, benefits and alternatives for the proposed anesthesia with the patient or authorized representative who has indicated his/her understanding and acceptance.     Dental Advisory Given  Plan Discussed with: Anesthesiologist, CRNA and Surgeon  Anesthesia Plan Comments:         Anesthesia Quick Evaluation

## 2019-11-05 NOTE — Interval H&P Note (Signed)
History and Physical Interval Note:  11/05/2019 1:09 PM  Kathleen Riddle  has presented today for surgery, with the diagnosis of SCREENING UGI SYMPTOMS.  The various methods of treatment have been discussed with the patient and family. After consideration of risks, benefits and other options for treatment, the patient has consented to  Procedure(s): COLONOSCOPY WITH PROPOFOL (N/A) ESOPHAGOGASTRODUODENOSCOPY (EGD) WITH PROPOFOL (N/A) as a surgical intervention.  The patient's history has been reviewed, patient examined, no change in status, stable for surgery.  I have reviewed the patient's chart and labs.  Questions were answered to the patient's satisfaction.     [place], [place]

## 2019-11-06 ENCOUNTER — Encounter: Payer: Self-pay | Admitting: Internal Medicine

## 2019-11-07 LAB — SURGICAL PATHOLOGY

## 2020-04-22 ENCOUNTER — Other Ambulatory Visit: Payer: 59

## 2020-04-22 ENCOUNTER — Other Ambulatory Visit: Payer: Self-pay

## 2020-04-22 DIAGNOSIS — Z20822 Contact with and (suspected) exposure to covid-19: Secondary | ICD-10-CM

## 2020-04-23 ENCOUNTER — Encounter: Payer: Self-pay | Admitting: Internal Medicine

## 2020-04-24 LAB — SARS-COV-2, NAA 2 DAY TAT

## 2020-04-24 LAB — NOVEL CORONAVIRUS, NAA: SARS-CoV-2, NAA: DETECTED — AB

## 2020-04-26 ENCOUNTER — Telehealth: Payer: Self-pay | Admitting: Internal Medicine

## 2020-04-26 NOTE — Telephone Encounter (Signed)
Patient tested positive for covid and needed to know how many days to quarantine.

## 2020-04-26 NOTE — Telephone Encounter (Signed)
Patient called to request clarification of quarantine for covid positive status.  Patient requesting if she needed another covid test to return to work. Reviewed with patient updated quarantine guidelines from [facility] and to ask her employer if a negative test result was required. Patient is no longer having symptoms. On day 7 of quarantine. Patient notified earlier of positive COVID-19 test results. Pt verbalized understanding. Pt reports symptoms are gone .  Criteria for self-isolation if you test positive for COVID-19, regardless of vaccination status:  -If you have mild symptoms that are resolving or have resolved, isolate at home for 5 days since symptoms started AND continue to wear a well-fitted mask when around others in the home and in public for 5 additional days after isolation is completed -If you have a fever and/or moderate to severe symptoms, isolate for at least 10 days since the symptoms started AND until you are fever free for at least 24 hours without the use of fever-reducing medications -If you tested positive and did not have symptoms, isolate for at least 5 days after your positive test  Use over-the-counter medications for symptoms.If you develop respiratory issues/distress, seek medical care in the Emergency Department.  If you must leave home or if you have to be around others please wear a mask. Please limit contact with immediate family members in the home, practice social distancing, frequent handwashing and clean hard surfaces touched frequently with household cleaning products. Members of your household will also need to quarantine and test.You may also be contacted by the health department for follow up. [facility] Department notified. Patient verbalized understanding and will call back or call PCP for further questions.

## 2020-04-27 ENCOUNTER — Telehealth: Payer: Self-pay | Admitting: Adult Health

## 2020-04-27 NOTE — Telephone Encounter (Signed)
Called to discuss with patient about COVID-19 symptoms and the use of one of the available treatments for those with mild to moderate Covid symptoms and at a high risk of hospitalization.  Pt appears to qualify for outpatient treatment due to co-morbid conditions and/or a member of an at-risk group in accordance with the FDA Emergency Use Authorization.   Called and talked to patient about her COVID 46.  She is day 8 of symptoms, improving, and outside of the window for treatment.  I reviewed isolation precautions.  She verbalized understanding and thanked me for the call.   Scot Dock

## 2020-08-13 ENCOUNTER — Ambulatory Visit (INDEPENDENT_AMBULATORY_CARE_PROVIDER_SITE_OTHER): Payer: 59 | Admitting: Obstetrics and Gynecology

## 2020-08-13 ENCOUNTER — Other Ambulatory Visit (HOSPITAL_COMMUNITY)
Admission: RE | Admit: 2020-08-13 | Discharge: 2020-08-13 | Disposition: A | Discharge status: Home / Self Care | Payer: 59 | Source: Ambulatory Visit | Attending: Obstetrics and Gynecology | Admitting: Obstetrics and Gynecology

## 2020-08-13 ENCOUNTER — Encounter: Payer: Self-pay | Admitting: Obstetrics and Gynecology

## 2020-08-13 ENCOUNTER — Other Ambulatory Visit: Payer: Self-pay

## 2020-08-13 VITALS — BP 138/82 | HR 72 | Ht 62.0 in | Wt 155.0 lb

## 2020-08-13 DIAGNOSIS — N891 Moderate vaginal dysplasia: Secondary | ICD-10-CM

## 2020-08-13 DIAGNOSIS — Z1239 Encounter for other screening for malignant neoplasm of breast: Secondary | ICD-10-CM

## 2020-08-13 DIAGNOSIS — Z01419 Encounter for gynecological examination (general) (routine) without abnormal findings: Secondary | ICD-10-CM | POA: Diagnosis not present

## 2020-08-13 DIAGNOSIS — Z1231 Encounter for screening mammogram for malignant neoplasm of breast: Secondary | ICD-10-CM

## 2020-08-13 NOTE — Patient Instructions (Signed)
[facility] [address] [place] [address]  [facility] [place]  [address] [place]  [address]  Phone: [phone]  

## 2020-08-13 NOTE — Progress Notes (Signed)
Gynecology Annual Exam  PCP: Adin Hector, MD  Chief Complaint: No chief complaint on file.   History of Present Illness:Patient is a 65 y.o. G3P3001 presents for annual exam. The patient has no complaints today.   LMP: No LMP recorded. Patient has had a hysterectomy.  The patient is not currently sexually active. She denies dyspareunia.  The patient does perform self breast exams.  There is no notable family history of breast or ovarian cancer in her family.  The patient wears seatbelts: yes.   The patient has regular exercise: not asked.    The patient denies current symptoms of depression.     Review of Systems: Review of Systems  Constitutional: Negative for chills and fever.  HENT: Negative for congestion.   Respiratory: Negative for cough and shortness of breath.   Cardiovascular: Negative for chest pain and palpitations.  Gastrointestinal: Negative for abdominal pain, constipation, diarrhea, heartburn, nausea and vomiting.  Genitourinary: Negative for dysuria, frequency and urgency.  Skin: Negative for itching and rash.  Neurological: Negative for dizziness and headaches.  Endo/Heme/Allergies: Negative for polydipsia.  Psychiatric/Behavioral: Negative for depression.    Past Medical History:  Patient Active Problem List   Diagnosis Date Noted  . VAIN II (vaginal intraepithelial neoplasia grade II) 10/03/2016    10/20/2019 Colposcopy 08/12/2019 LGSIL HPV positive (16/18 negative) 06/20/2018 Colposcopy VAIN I at 9 O'Clock 05/03/2018 LGSIL pap HPV positive 11/01/2016 Colposcopy negative 10/03/2016 Pap LGSIL HPV positive 02/18/2015 Colposcopy VAIN 2 at 9 O'Clock biopsy 08/07/2014 Colposcopy VAIN 2 at 9 O'Clock biopsy 06/18/2014 Pap LGSIL  11/21/2013 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 at 12 to 3 O'Clock 10/09/2013 Colposcopy CIN II at 2 O;Clock, ECC HGSIL 09/14/2013 Pap LGSIL      Past Surgical History:  Past Surgical History:  Procedure Laterality Date  .  COLONOSCOPY WITH PROPOFOL N/A 11/05/2019   Procedure: COLONOSCOPY WITH PROPOFOL;  Surgeon: Toledo, Benay Pike, MD;  Location: [facility] ENDOSCOPY;  Service: Gastroenterology;  Laterality: N/A;  . COLPOSCOPY    . ESOPHAGOGASTRODUODENOSCOPY (EGD) WITH PROPOFOL N/A 11/05/2019   Procedure: ESOPHAGOGASTRODUODENOSCOPY (EGD) WITH PROPOFOL;  Surgeon: Toledo, Benay Pike, MD;  Location: [facility] ENDOSCOPY;  Service: Gastroenterology;  Laterality: N/A;  . PARTIAL HYSTERECTOMY      Gynecologic History:  No LMP recorded. Patient has had a hysterectomy. Last Pap: Results were: 10/20/2019 Colposcopy VAIN I 08/12/2019 LGSIL HPV positive (16/18 negative) 06/20/2018 Colposcopy VAIN I at 9 O'Clock 05/03/2018 LGSIL pap HPV positive 11/01/2016 Colposcopy negative 10/03/2016 Pap LGSIL HPV positive 02/18/2015 Colposcopy VAIN 2 at 9 O'Clock biopsy 08/07/2014 Colposcopy VAIN 2 at 9 O'Clock biopsy 06/18/2014 Pap LGSIL  11/21/2013 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 at 12 to 3 O'Clock 10/09/2013 Colposcopy CIN II at 2 O;Clock, ECC HGSIL 09/14/2013 Pap LGSIL Last mammogram: 10/09/2019 Results were: BI-RAD I  Obstetric History: [id number]  Family History:  Family History  Problem Relation Age of Onset  . Breast cancer Neg Hx   . Bladder Cancer Neg Hx   . Kidney cancer Neg Hx     Social History:  Social History   Socioeconomic History  . Marital status: Unknown    Spouse name: Not on file  . Number of children: Not on file  . Years of education: Not on file  . Highest education level: Not on file  Occupational History  . Not on file  Tobacco Use  . Smoking status: Current Every Day Smoker    Packs/day: 0.50  Years: 46.00    Pack years: 23.00    Types: Cigarettes  . Smokeless tobacco: Never Used  Vaping Use  . Vaping Use: Never used  Substance and Sexual Activity  . Alcohol use: No  . Drug use: No  . Sexual activity: Yes  Other Topics Concern  . Not on file  Social History Narrative   ** Merged History Encounter  **       Social Determinants of Health   Financial Resource Strain: Not on file  Food Insecurity: Not on file  Transportation Needs: Not on file  Physical Activity: Not on file  Stress: Not on file  Social Connections: Not on file  Intimate Partner Violence: Not on file    Allergies:  No Known Allergies  Medications: Prior to Admission medications   Medication Sig Start Date End Date Taking? Authorizing Provider  amLODipine (NORVASC) 2.5 MG tablet Take by mouth. 05/05/16   [provider]  aspirin 81 MG EC tablet Take by mouth.    [provider]  atorvastatin (LIPITOR) 40 MG tablet Take 40 mg by mouth daily. 08/02/19   [provider]  bisacodyl (DULCOLAX) 5 MG EC tablet Take 5 mg by mouth daily as needed for moderate constipation.    [provider]  diclofenac sodium (VOLTAREN) 1 % GEL Apply topically 4 (four) times daily.    [provider]  lactulose (CHRONULAC) 10 GM/15ML solution Take by mouth 3 (three) times daily.    [provider]  meloxicam (MOBIC) 7.5 MG tablet 1-2 tablets as needed for pain. 01/19/15   [provider]  triamcinolone cream (KENALOG) 0.1 % Apply 1 application topically 2 (two) times daily.    [provider]    Physical Exam Vitals: Blood pressure 138/82, pulse 72, height 5\' 2"  (1.575 m), weight 155 lb (70.3 kg).  General: NAD, well nourished, appears stated age 85: normocephalic, anicteric Thyroid: no enlargement, no palpable nodules Pulmonary: No increased work of breathing, CTAB Cardiovascular: RRR, distal pulses 2+ Breast: Breast symmetrical, no tenderness, no palpable nodules or masses, no skin or nipple retraction present, no nipple discharge.  No axillary or supraclavicular lymphadenopathy. Abdomen: NABS, soft, non-tender, non-distended.  Umbilicus without lesions.  No hepatomegaly, splenomegaly or masses palpable. No evidence of hernia  Genitourinary:  External:  Normal external female genitalia.  Normal urethral meatus, normal Bartholin's and Skene's glands.    Vagina: Normal vaginal mucosa, no evidence of prolapse.    Cervix: surgically absent  Uterus: surgically absent  Adnexa: ovaries non-enlarged, no adnexal masses  Rectal: deferred  Lymphatic: no evidence of inguinal lymphadenopathy Extremities: no edema, erythema, or tenderness Neurologic: Grossly intact Psychiatric: mood appropriate, affect full  Female chaperone present for pelvic and breast  portions of the physical exam  Assessment: 65 y.o. G3P3001 routine annual exam  Plan: Problem List Items Addressed This Visit      Genitourinary   VAIN II (vaginal intraepithelial neoplasia grade II)   Relevant Orders   Cytology - PAP    Other Visit Diagnoses    Encounter for gynecological examination without abnormal finding    -  Primary   Breast screening       Breast cancer screening by mammogram       Relevant Orders   MM 3D SCREEN BREAST BILATERAL      1) Mammogram - recommend yearly screening mammogram.  Mammogram Is up to date  - next due in July order placed  2) STI screening  was notoffered  and therefore not obtained  3) ASCCP guidelines and rational discussed.  Continue yearly follow up for VAIN.  Has been improving VAIN I on last two colposcopic biopsies, status post TVH 2015 (CIN II)  4) Osteoporosis  - per USPTF routine screening DEXA at age 24  5) Routine healthcare maintenance including cholesterol, diabetes screening discussed managed by PCP Dr. Caryl Comes  6) Colonoscopy UTD 11/05/2019 Dr. Alice Reichert  7) Return in about 1 year (around 08/13/2021) for annual.    [name], MD Mosetta Pigeon, [place] 08/13/2020, 8:24 AM

## 2020-08-17 LAB — CYTOLOGY - PAP
Comment: NEGATIVE
High risk HPV: POSITIVE — AB

## 2020-10-12 ENCOUNTER — Ambulatory Visit
Admission: RE | Admit: 2020-10-12 | Discharge: 2020-10-12 | Disposition: A | Discharge status: Home / Self Care | Payer: 59 | Source: Ambulatory Visit | Attending: Obstetrics and Gynecology | Admitting: Obstetrics and Gynecology

## 2020-10-12 ENCOUNTER — Other Ambulatory Visit: Payer: Self-pay

## 2020-10-12 DIAGNOSIS — Z1231 Encounter for screening mammogram for malignant neoplasm of breast: Secondary | ICD-10-CM | POA: Diagnosis present

## 2020-10-18 ENCOUNTER — Telehealth: Payer: Self-pay | Admitting: *Deleted

## 2020-10-18 DIAGNOSIS — F17200 Nicotine dependence, unspecified, uncomplicated: Secondary | ICD-10-CM

## 2020-10-18 DIAGNOSIS — Z122 Encounter for screening for malignant neoplasm of respiratory organs: Secondary | ICD-10-CM

## 2020-10-18 DIAGNOSIS — Z87891 Personal history of nicotine dependence: Secondary | ICD-10-CM

## 2020-10-18 NOTE — Telephone Encounter (Signed)
Spoke to patient via telephone re: scheduling her annual low dose lung screening ct scan. Current smoker, 36 pack years. Scan scheduled for 10/27/20 @ 4:30 pm.

## 2020-10-27 ENCOUNTER — Ambulatory Visit
Admission: RE | Admit: 2020-10-27 | Discharge: 2020-10-27 | Disposition: A | Discharge status: Home / Self Care | Payer: 59 | Source: Ambulatory Visit | Attending: Acute Care | Admitting: Acute Care

## 2020-10-27 ENCOUNTER — Other Ambulatory Visit: Payer: Self-pay

## 2020-10-27 DIAGNOSIS — F17200 Nicotine dependence, unspecified, uncomplicated: Secondary | ICD-10-CM | POA: Insufficient documentation

## 2020-10-27 DIAGNOSIS — Z122 Encounter for screening for malignant neoplasm of respiratory organs: Secondary | ICD-10-CM | POA: Insufficient documentation

## 2020-10-27 DIAGNOSIS — Z87891 Personal history of nicotine dependence: Secondary | ICD-10-CM | POA: Diagnosis not present

## 2020-10-29 ENCOUNTER — Encounter: Payer: Self-pay | Admitting: *Deleted

## 2020-11-11 NOTE — Progress Notes (Signed)
Please call patient and let them  know their  low dose Ct was read as a Lung RADS 2: nodules that are benign in appearance and behavior with a very low likelihood of becoming a clinically active cancer due to size or lack of growth. Recommendation per radiology is for a repeat LDCT in 12 months. .Please let them  know we will order and schedule their  annual screening scan for 10/2021. Please let them  know there was notation of CAD on their  scan.  Please remind the patient  that this is a non-gated exam therefore degree or severity of disease  cannot be determined. Please have them  follow up with their PCP regarding potential risk factor modification, dietary therapy or pharmacologic therapy if clinically indicated. Pt.  is not  currently on statin therapy. Please place order for annual  screening scan for  10/2021 and fax results to PCP. Thanks so much.  Denise, + CAD >> aortic atherosclerosis, in addition to left main and 3 vessel coronary artery disease. They are not on statin therapy. Please have them follow up with PCP regarding Assessment for potential risk factor modification, dietary therapy or pharmacologic therapy may be warranted, if clinically indicated. Thanks so much

## 2020-11-22 ENCOUNTER — Other Ambulatory Visit: Payer: Self-pay | Admitting: *Deleted

## 2020-11-22 DIAGNOSIS — F17200 Nicotine dependence, unspecified, uncomplicated: Secondary | ICD-10-CM

## 2020-11-22 DIAGNOSIS — Z87891 Personal history of nicotine dependence: Secondary | ICD-10-CM

## 2021-02-20 ENCOUNTER — Emergency Department: Payer: 59

## 2021-02-20 ENCOUNTER — Emergency Department
Admission: EM | Admit: 2021-02-20 | Discharge: 2021-02-20 | Disposition: A | Discharge status: Home / Self Care | Payer: 59 | Attending: Emergency Medicine | Admitting: Emergency Medicine

## 2021-02-20 ENCOUNTER — Other Ambulatory Visit: Payer: Self-pay

## 2021-02-20 ENCOUNTER — Emergency Department
Admission: EM | Admit: 2021-02-20 | Discharge: 2021-02-20 | Disposition: A | Discharge status: Still In Hospital | Payer: Self-pay

## 2021-02-20 DIAGNOSIS — R42 Dizziness and giddiness: Secondary | ICD-10-CM | POA: Diagnosis present

## 2021-02-20 DIAGNOSIS — F17210 Nicotine dependence, cigarettes, uncomplicated: Secondary | ICD-10-CM | POA: Insufficient documentation

## 2021-02-20 DIAGNOSIS — Z79899 Other long term (current) drug therapy: Secondary | ICD-10-CM | POA: Diagnosis not present

## 2021-02-20 DIAGNOSIS — I10 Essential (primary) hypertension: Secondary | ICD-10-CM | POA: Insufficient documentation

## 2021-02-20 LAB — COMPREHENSIVE METABOLIC PANEL
ALT: 11 U/L (ref 0–44)
AST: 16 U/L (ref 15–41)
Albumin: 4.1 g/dL (ref 3.5–5.0)
Alkaline Phosphatase: 82 U/L (ref 38–126)
Anion gap: 6 (ref 5–15)
BUN: 11 mg/dL (ref 8–23)
CO2: 26 mmol/L (ref 22–32)
Calcium: 9 mg/dL (ref 8.9–10.3)
Chloride: 109 mmol/L (ref 98–111)
Creatinine, Ser: 0.86 mg/dL (ref 0.44–1.00)
GFR, Estimated: >60 mL/min (ref >60)
Glucose, Bld: 123 mg/dL — ABNORMAL HIGH (ref 70–99)
Potassium: 3.4 mmol/L — ABNORMAL LOW (ref 3.5–5.1)
Sodium: 141 mmol/L (ref 135–145)
Total Bilirubin: 0.8 mg/dL (ref 0.3–1.2)
Total Protein: 7.6 g/dL (ref 6.5–8.1)

## 2021-02-20 LAB — CBC WITH DIFFERENTIAL/PLATELET
Abs Immature Granulocytes: 0.02 10*3/uL (ref 0.00–0.07)
Basophils Absolute: 0.1 10*3/uL (ref 0.0–0.1)
Basophils Relative: 1 %
Eosinophils Absolute: 0.7 10*3/uL — ABNORMAL HIGH (ref 0.0–0.5)
Eosinophils Relative: 9 %
HCT: 40.7 % (ref 36.0–46.0)
Hemoglobin: 12.9 g/dL (ref 12.0–15.0)
Immature Granulocytes: 0 %
Lymphocytes Relative: 49 %
Lymphs Abs: 3.5 10*3/uL (ref 0.7–4.0)
MCH: 28.7 pg (ref 26.0–34.0)
MCHC: 31.7 g/dL (ref 30.0–36.0)
MCV: 90.4 fL (ref 80.0–100.0)
Monocytes Absolute: 0.6 10*3/uL (ref 0.1–1.0)
Monocytes Relative: 8 %
Neutro Abs: 2.5 10*3/uL (ref 1.7–7.7)
Neutrophils Relative %: 33 %
Platelets: 290 10*3/uL (ref 150–400)
RBC: 4.5 MIL/uL (ref 3.87–5.11)
RDW: 13.1 % (ref 11.5–15.5)
WBC: 7.3 10*3/uL (ref 4.0–10.5)
nRBC: 0 % (ref 0.0–0.2)

## 2021-02-20 LAB — URINALYSIS, ROUTINE W REFLEX MICROSCOPIC
Bacteria, UA: NONE SEEN
Bilirubin Urine: NEGATIVE
Glucose, UA: NEGATIVE mg/dL
Ketones, ur: NEGATIVE mg/dL
Leukocytes,Ua: NEGATIVE
Nitrite: NEGATIVE
Protein, ur: 30 mg/dL — AB
Specific Gravity, Urine: >1.046 — ABNORMAL HIGH (ref 1.005–1.030)
WBC, UA: NONE SEEN WBC/hpf (ref 0–5)
pH: 6 (ref 5.0–8.0)

## 2021-02-20 LAB — TROPONIN I (HIGH SENSITIVITY)
Troponin I (High Sensitivity): 2 ng/L (ref <18)
Troponin I (High Sensitivity): 3 ng/L (ref <18)

## 2021-02-20 MED ORDER — ONDANSETRON HCL 4 MG/2ML IJ SOLN
4.0000 mg | Freq: Once | INTRAMUSCULAR | Status: AC
  Administered 2021-02-20: 4 mg via INTRAVENOUS
  Filled 2021-02-20: qty 2

## 2021-02-20 MED ORDER — MECLIZINE HCL 25 MG PO TABS: 25.0000 mg | ORAL_TABLET | Freq: Three times a day (TID) | ORAL | 0 refills | Status: AC | PRN

## 2021-02-20 MED ORDER — IOHEXOL 350 MG/ML SOLN
75.0000 mL | Freq: Once | INTRAVENOUS | Status: AC | PRN
  Administered 2021-02-20: 75 mL via INTRAVENOUS

## 2021-02-20 MED ORDER — MECLIZINE HCL 25 MG PO TABS
25.0000 mg | ORAL_TABLET | Freq: Once | ORAL | Status: AC
  Administered 2021-02-20: 25 mg via ORAL
  Filled 2021-02-20: qty 1

## 2021-02-20 MED ORDER — LORAZEPAM 2 MG/ML IJ SOLN: 1.0000 mg | INTRAMUSCULAR | Status: DC | PRN

## 2021-02-20 MED ORDER — POTASSIUM CHLORIDE CRYS ER 20 MEQ PO TBCR
20.0000 meq | EXTENDED_RELEASE_TABLET | Freq: Once | ORAL | Status: AC
  Administered 2021-02-20: 20 meq via ORAL
  Filled 2021-02-20: qty 1

## 2021-02-20 NOTE — ED Notes (Signed)
Pt states that she is feeling better after meclazine but still feels dizzy- pt walked around room with steady gait

## 2021-02-20 NOTE — ED Provider Notes (Signed)
[facility] Emergency Department Provider Note   ____________________________________________   Event Date/Time   First MD Initiated Contact with Patient 02/20/21 [phone]     (approximate)  I have reviewed the triage vital signs and the nursing notes.   HISTORY  Chief Complaint Dizziness    HPI Kathleen Riddle is a 65 y.o. female with past medical history of hypertension and hyperlipidemia who presents to the ED complaining of dizziness.  Patient reports that she felt fine when she went to bed around 9 PM last night, then woke up around 230 with sensation of dizziness and unsteadiness on her feet.  She states that she had to hold onto something while she was walking to the bathroom, felt nauseous and ended up vomiting a couple of times.  She has felt dizzy with a sensation of the room spinning around her at times.  She denies any vision changes, speech changes, numbness, or weakness.  She has not had any fevers, cough, chest pain, or shortness of breath.  She reports an episode of similar symptoms in the past multiple years ago, was seen at [facility] at that time and "they gave me a medicine and I felt better."  She is not sure whether she has been diagnosed with vertigo in the past.  She states she feels better when she is sitting still but began to feel unsteady when she stands up.        Past Medical History:  Diagnosis Date   Abnormal Pap smear of cervix    Adrenal adenoma    Aortic atherosclerosis (HCC)    HGSIL (high grade squamous intraepithelial lesion) on Pap smear of cervix    Horseshoe kidney    Hypercholesteremia    Hypertension    Microscopic hematuria    VAIN II (vaginal intraepithelial neoplasia grade II)     Patient Active Problem List   Diagnosis Date Noted   VAIN II (vaginal intraepithelial neoplasia grade II) 10/03/2016    Past Surgical History:  Procedure Laterality Date   COLONOSCOPY WITH PROPOFOL N/A 11/05/2019   Procedure:  COLONOSCOPY WITH PROPOFOL;  Surgeon: Toledo, Benay Pike, MD;  Location: [facility] ENDOSCOPY;  Service: Gastroenterology;  Laterality: N/A;   COLPOSCOPY     ESOPHAGOGASTRODUODENOSCOPY (EGD) WITH PROPOFOL N/A 11/05/2019   Procedure: ESOPHAGOGASTRODUODENOSCOPY (EGD) WITH PROPOFOL;  Surgeon: Toledo, Benay Pike, MD;  Location: [facility] ENDOSCOPY;  Service: Gastroenterology;  Laterality: N/A;   PARTIAL HYSTERECTOMY      Prior to Admission medications   Medication Sig Start Date End Date Taking? Authorizing Provider  meclizine (ANTIVERT) 25 MG tablet Take 1 tablet (25 mg total) by mouth 3 (three) times daily as needed for dizziness. 02/20/21  Yes Blake Divine, MD  amLODipine (NORVASC) 2.5 MG tablet Take by mouth. 05/05/16   [provider]  aspirin 81 MG EC tablet Take by mouth.    [provider]  bisacodyl (DULCOLAX) 5 MG EC tablet Take 5 mg by mouth daily as needed for moderate constipation.    [provider]  diclofenac sodium (VOLTAREN) 1 % GEL Apply topically 4 (four) times daily.    [provider]  ezetimibe (ZETIA) 10 MG tablet Take 10 mg by mouth daily. 07/14/20   [provider]  lactulose (CHRONULAC) 10 GM/15ML solution Take by mouth 3 (three) times daily.    [provider]  meloxicam (MOBIC) 7.5 MG tablet 1-2 tablets as needed for pain. Patient not taking: Reported on 08/13/2020 01/19/15   [provider]  pantoprazole (PROTONIX) 40 MG tablet Take by mouth. 03/17/20 03/17/21  [provider]  triamcinolone cream (KENALOG) 0.1 % Apply 1 application topically 2 (two) times daily.    [provider]    Allergies Atorvastatin and Pravastatin  Family History  Problem Relation Age of Onset   Breast cancer Neg Hx    Bladder Cancer Neg Hx    Kidney cancer Neg Hx     Social History Social History   Tobacco Use   Smoking status: Every Day    Packs/day: 0.50    Years: 46.00    Pack years: 23.00    Types: Cigarettes    Smokeless tobacco: Never  Vaping Use   Vaping Use: Never used  Substance Use Topics   Alcohol use: No   Drug use: No    Review of Systems  Constitutional: No fever/chills Eyes: No visual changes. ENT: No sore throat. Cardiovascular: Denies chest pain. Respiratory: Denies shortness of breath. Gastrointestinal: No abdominal pain.  Positive for nausea and vomiting.  No diarrhea.  No constipation. Genitourinary: Negative for dysuria. Musculoskeletal: Negative for back pain. Skin: Negative for rash. Neurological: Negative for headaches, focal weakness or numbness.  Positive for dizziness and unsteady gait.  ____________________________________________   PHYSICAL EXAM:  VITAL SIGNS: ED Triage Vitals  Enc Vitals Group     BP 02/20/21 0348 (!) 151/135     Pulse Rate 02/20/21 0348 88     Resp 02/20/21 0348 (!) 22     Temp 02/20/21 0348 99.2 F (37.3 C)     Temp Source 02/20/21 0348 Oral     SpO2 02/20/21 0348 98 %     Weight 02/20/21 0348 154 lb (69.9 kg)     Height 02/20/21 0348 5\' 2"  (1.575 m)     Head Circumference --      Peak Flow --      Pain Score 02/20/21 0348 0     Pain Loc --      Pain Edu? --      Excl. in [place]? --     Constitutional: Alert and oriented. Eyes: Conjunctivae are normal. Head: Atraumatic. Nose: No congestion/rhinnorhea. Mouth/Throat: Mucous membranes are moist. Neck: Normal ROM Cardiovascular: Normal rate, regular rhythm. Grossly normal heart sounds.  2+ radial pulses bilaterally. Respiratory: Normal respiratory effort.  No retractions. Lungs CTAB. Gastrointestinal: Soft and nontender. No distention. Genitourinary: deferred Musculoskeletal: No lower extremity tenderness nor edema. Neurologic:  Normal speech and language. No gross focal neurologic deficits are appreciated. Skin:  Skin is warm, dry and intact. No rash noted. Psychiatric: Mood and affect are normal. Speech and behavior are  normal.  ____________________________________________   LABS (all labs ordered are listed, but only abnormal results are displayed)  Labs Reviewed  CBC WITH DIFFERENTIAL/PLATELET - Abnormal; Notable for the following components:      Result Value   Eosinophils Absolute 0.7 (*)    All other components within normal limits  COMPREHENSIVE METABOLIC PANEL - Abnormal; Notable for the following components:   Potassium 3.4 (*)    Glucose, Bld 123 (*)    All other components within normal limits  URINALYSIS, ROUTINE W REFLEX MICROSCOPIC - Abnormal; Notable for the following components:   Color, Urine YELLOW (*)    APPearance CLEAR (*)    Specific Gravity, Urine >1.046 (*)    Hgb urine dipstick SMALL (*)    Protein, ur 30 (*)    All other components within normal limits  TROPONIN I (HIGH SENSITIVITY)  TROPONIN I (HIGH SENSITIVITY)   ____________________________________________  EKG  ED ECG REPORT I, Blake Divine, the attending physician, personally viewed and interpreted this ECG.   Date: 02/20/2021  EKG Time: 3:51  Rate: 76  Rhythm: normal sinus rhythm  Axis: Normal  Intervals:none  ST&T Change: None   PROCEDURES  Procedure(s) performed (including Critical Care):  Procedures   ____________________________________________   INITIAL IMPRESSION / ASSESSMENT AND PLAN / ED COURSE      65 year old female with past medical history of hypertension and hyperlipidemia presents to the ED with dizziness, feeling like the room is spinning around her, unsteady gait, and vomiting since waking up around 230 this morning.  She has no focal neurologic deficits on exam and I have lower suspicion for stroke, symptoms sound most consistent with a peripheral vertigo.  CTA of head and neck was performed from triage and negative for acute process.  EKG shows no evidence of arrhythmia or ischemia and 2 sets of troponin are negative, doubt cardiac etiology for her symptoms.  Patient does not  have any symptoms to suggest an infectious etiology, UA shows no signs of infection.  She states that nausea has resolved, we will give a dose of meclizine, have patient ambulate, and reassess.  Patient continues to feel dizzy with ambulation despite dose of meclizine.  We will check MRI brain to ensure no central cause of patient's dizziness.  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  Patient is appropriate for discharge home with PCP follow-up, we will prescribe meclizine for apparent peripheral vertigo.  She was counseled to return to the ED for new or worsening symptoms, patient agrees with plan.      ____________________________________________   FINAL CLINICAL IMPRESSION(S) / ED DIAGNOSES  Final diagnoses:  Dizziness  Vertigo     ED Discharge Orders          Ordered    meclizine (ANTIVERT) 25 MG tablet  3 times daily PRN        02/20/21 1215             Note:  This document was prepared using Dragon voice recognition software and may include unintentional dictation errors.    Blake Divine, MD 02/20/21 [phone]

## 2021-02-20 NOTE — ED Triage Notes (Signed)
Pt states she went to bed at 1900 last night and felt fine. Pt states she woke up at 0245 this am dizzy and nauseated. Pt is able to move all extremities without difficulty, denies chest pain or shob. Pt is actively vomiting in triage. Pt states she feels like she is spinning. Pt denies sensory change in arms or legs, denies headache.

## 2021-02-20 NOTE — ED Provider Notes (Signed)
HPI: Pt is a 65 y.o. female who presents with complaints of dzzy  The patient p/w  dizzyness. LLK 1900 now with suddent onset of dizzy and nausea at 0245. Some chest pain yesterday. Actively vomiting.   ROS: Denies fever, chest pain, vomiting  Past Medical History:  Diagnosis Date   Abnormal Pap smear of cervix    Adrenal adenoma    Aortic atherosclerosis (HCC)    HGSIL (high grade squamous intraepithelial lesion) on Pap smear of cervix    Horseshoe kidney    Hypercholesteremia    Hypertension    Microscopic hematuria    VAIN II (vaginal intraepithelial neoplasia grade II)    There were no vitals filed for this visit.  Focused Physical Exam: Gen: No acute distress Head: atraumatic, normocephalic Eyes: Extraocular movements grossly intact; conjunctiva clear CV: RRR Lung: No increased WOB, no stridor GI: ND, no obvious masses Neuro: Alert and awake  Medical Decision Making and Plan: Given the patient's initial medical screening exam, the following diagnostic evaluation has been ordered. The patient will be placed in the appropriate treatment space, once one is available, to complete the evaluation and treatment. I have discussed the plan of care with the patient and I have advised the patient that an ED physician or mid-level practitioner will reevaluate their condition after the test results have been received, as the results may give them additional insight into the type of treatment they may need.   Diagnostics: CT, labs  Out of window for TPA stroke code not called.   Treatments: none immediately   Vanessa [place], MD 02/20/21 [phone]

## 2021-05-04 ENCOUNTER — Ambulatory Visit (INDEPENDENT_AMBULATORY_CARE_PROVIDER_SITE_OTHER): Payer: 59 | Admitting: Internal Medicine

## 2021-05-04 ENCOUNTER — Other Ambulatory Visit: Payer: Self-pay

## 2021-05-04 ENCOUNTER — Encounter: Payer: Self-pay | Admitting: Internal Medicine

## 2021-05-04 VITALS — BP 130/74 | HR 63 | Ht 62.0 in | Wt 154.2 lb

## 2021-05-04 DIAGNOSIS — J301 Allergic rhinitis due to pollen: Secondary | ICD-10-CM | POA: Diagnosis not present

## 2021-05-04 DIAGNOSIS — I10 Essential (primary) hypertension: Secondary | ICD-10-CM | POA: Diagnosis not present

## 2021-05-04 DIAGNOSIS — I700 Atherosclerosis of aorta: Secondary | ICD-10-CM | POA: Diagnosis not present

## 2021-05-04 DIAGNOSIS — E8881 Metabolic syndrome: Secondary | ICD-10-CM

## 2021-05-04 DIAGNOSIS — Z1231 Encounter for screening mammogram for malignant neoplasm of breast: Secondary | ICD-10-CM

## 2021-05-04 DIAGNOSIS — N891 Moderate vaginal dysplasia: Secondary | ICD-10-CM

## 2021-05-04 NOTE — Assessment & Plan Note (Signed)

## 2021-05-04 NOTE — Assessment & Plan Note (Signed)

## 2021-05-04 NOTE — Assessment & Plan Note (Signed)
Hypercholesterolemia  I advised the patient to follow Mediterranean diet This diet is rich in fruits vegetables and whole grain, and This diet is also rich in fish and lean meat Patient should also eat a handful of almonds or walnuts daily Recent heart study indicated that average follow-up on this kind of diet reduces the cardiovascular mortality by 50 to 70%== 

## 2021-05-04 NOTE — Assessment & Plan Note (Signed)
Take Claritin 5 mg p.o. daily 

## 2021-05-04 NOTE — Addendum Note (Signed)
Addended by: Alois Cliche on: 05/04/2021 02:10 PM   Modules accepted: Orders

## 2021-05-04 NOTE — Assessment & Plan Note (Signed)
Refer to OB/GYN 

## 2021-05-04 NOTE — Progress Notes (Signed)
Established Patient Office Visit  Subjective:  Patient ID: Kathleen Riddle, female    DOB: 03-Apr-1956  Age: 66 y.o. MRN: [id number]  CC:  Chief Complaint  Patient presents with   New Patient (Initial Visit)    HPI  Kathleen Riddle presents for   .1 Hypertension-stable on current therapy; follow-up with home if able. Limit salt. Follow met b, u/a periodically   2 Dyspepsia. Markedly improved after treatment for H. pylori; ENT notes indicate patient still with reflux, so trial changing omeprazole and famotidine over to pantoprazole. She will need to price this first. 3 Coronary disease by CT. no new symptoms but is at high risk for progression. Congratulated on her smoking reduction and encouraged continued cessation efforts. Cholesterol is better with atorvastatin, however patient with elevated transaminases and very elevated alk phos; she will stop the atorvastatin and we will plan to recheck her liver enzymes in the next 1 week. If she develops new abdominal symptoms, changes in urine or stool,  4 History of abnormal Pap smear. Have encouraged her to follow-up with [facility] gynecology. 5. Hyperlipidemia/aortic atherosclerosis. Holding statin therapy secondary to elevated liver enzymes as above; continue antiplatelet therapy. Follow liver, lipids, TSH periodically 6. Tobacco abuse. Discussed looking cessation at length;  7. Hyperglycemia. Continue lifestyle efforts; following met B, A1c, urinalysis periodically 8. Sensation of lump in throat/nasal obstruction. Appreciate ENT; we will see whether her throat symptoms improved with the pantoprazole. She may ultimately need surgery for the nasal obstruction.  Past Medical History:  Diagnosis Date   Abnormal Pap smear of cervix    Adrenal adenoma    Aortic atherosclerosis (HCC)    HGSIL (high grade squamous intraepithelial lesion) on Pap smear of cervix    Horseshoe kidney    Hypercholesteremia    Hypertension    Microscopic hematuria     VAIN II (vaginal intraepithelial neoplasia grade II)     Past Surgical History:  Procedure Laterality Date   COLONOSCOPY WITH PROPOFOL N/A 11/05/2019   Procedure: COLONOSCOPY WITH PROPOFOL;  Surgeon: Toledo, Benay Pike, MD;  Location: [facility] ENDOSCOPY;  Service: Gastroenterology;  Laterality: N/A;   COLPOSCOPY     ESOPHAGOGASTRODUODENOSCOPY (EGD) WITH PROPOFOL N/A 11/05/2019   Procedure: ESOPHAGOGASTRODUODENOSCOPY (EGD) WITH PROPOFOL;  Surgeon: Toledo, Benay Pike, MD;  Location: [facility] ENDOSCOPY;  Service: Gastroenterology;  Laterality: N/A;   PARTIAL HYSTERECTOMY      Family History  Problem Relation Age of Onset   Breast cancer Neg Hx    Bladder Cancer Neg Hx    Kidney cancer Neg Hx     Social History   Socioeconomic History   Marital status: Single    Spouse name: Not on file   Number of children: Not on file   Years of education: Not on file   Highest education level: Not on file  Occupational History   Not on file  Tobacco Use   Smoking status: Every Day    Packs/day: 0.50    Years: 46.00    Pack years: 23.00    Types: Cigarettes   Smokeless tobacco: Never  Vaping Use   Vaping Use: Never used  Substance and Sexual Activity   Alcohol use: No   Drug use: No   Sexual activity: Yes    Birth control/protection: None  Other Topics Concern   Not on file  Social History Narrative   ** Merged History Encounter **       ** Merged History Encounter **  Social Determinants of Health   Financial Resource Strain: Not on file  Food Insecurity: Not on file  Transportation Needs: Not on file  Physical Activity: Not on file  Stress: Not on file  Social Connections: Not on file  Intimate Partner Violence: Not on file     Current Outpatient Medications:    amLODipine (NORVASC) 2.5 MG tablet, Take by mouth., Disp: , Rfl:    aspirin 81 MG EC tablet, Take by mouth., Disp: , Rfl:    bisacodyl (DULCOLAX) 5 MG EC tablet, Take 5 mg by mouth daily as needed for moderate  constipation., Disp: , Rfl:    citalopram (CELEXA) 20 MG tablet, Take 20 mg by mouth daily., Disp: , Rfl:    diclofenac sodium (VOLTAREN) 1 % GEL, Apply topically 4 (four) times daily., Disp: , Rfl:    ezetimibe (ZETIA) 10 MG tablet, Take 10 mg by mouth daily., Disp: , Rfl:    lactulose (CHRONULAC) 10 GM/15ML solution, Take by mouth 3 (three) times daily., Disp: , Rfl:    meclizine (ANTIVERT) 25 MG tablet, Take 1 tablet (25 mg total) by mouth 3 (three) times daily as needed for dizziness., Disp: 30 tablet, Rfl: 0   meloxicam (MOBIC) 7.5 MG tablet, , Disp: , Rfl:    triamcinolone cream (KENALOG) 0.1 %, Apply 1 application topically 2 (two) times daily., Disp: , Rfl:    pantoprazole (PROTONIX) 40 MG tablet, Take by mouth., Disp: , Rfl:    Allergies  Allergen Reactions   Atorvastatin     Other reaction(s): Liver Disorder Patient with cholestasis/elevated liver enzymes   Pravastatin     Other reaction(s): Muscle Pain    ROS Review of Systems  Constitutional: Negative.   HENT: Negative.    Eyes: Negative.   Respiratory: Negative.    Cardiovascular: Negative.   Gastrointestinal: Negative.   Endocrine: Negative.   Genitourinary: Negative.   Musculoskeletal: Negative.   Skin: Negative.   Allergic/Immunologic: Negative.   Neurological: Negative.   Hematological: Negative.   Psychiatric/Behavioral: Negative.    All other systems reviewed and are negative.    Objective:    Physical Exam Vitals reviewed.  Constitutional:      Appearance: Normal appearance.  HENT:     Mouth/Throat:     Mouth: Mucous membranes are moist.  Eyes:     Pupils: Pupils are equal, round, and reactive to light.  Neck:     Vascular: No carotid bruit.  Cardiovascular:     Rate and Rhythm: Normal rate and regular rhythm.     Pulses: Normal pulses.     Heart sounds: Normal heart sounds.  Pulmonary:     Effort: Pulmonary effort is normal.     Breath sounds: Normal breath sounds.  Abdominal:      General: Bowel sounds are normal.     Palpations: Abdomen is soft. There is no hepatomegaly, splenomegaly or mass.     Tenderness: There is no abdominal tenderness.     Hernia: No hernia is present.  Musculoskeletal:        General: No tenderness.     Cervical back: Neck supple.     Right lower leg: No edema.     Left lower leg: No edema.  Skin:    Findings: No rash.  Neurological:     Mental Status: She is alert and oriented to person, place, and time.     Motor: No weakness.  Psychiatric:        Mood and Affect: Mood  and affect normal.        Behavior: Behavior normal.    BP 130/74    Pulse 63    Ht '5\' 2"'  (1.575 m)    Wt 154 lb 3.2 oz (69.9 kg)    BMI 28.20 kg/m  Wt Readings from Last 3 Encounters:  05/04/21 154 lb 3.2 oz (69.9 kg)  02/20/21 154 lb (69.9 kg)  10/27/20 150 lb (68 kg)     Health Maintenance Due  Topic Date Due   HIV Screening  Never done   Hepatitis C Screening  Never done   Zoster Vaccines- Shingrix (1 of 2) Never done   INFLUENZA VACCINE  Never done    There are no preventive care reminders to display for this patient.  No results found for: TSH Lab Results  Component Value Date   WBC 7.3 02/20/2021   HGB 12.9 02/20/2021   HCT 40.7 02/20/2021   MCV 90.4 02/20/2021   PLT 290 02/20/2021   Lab Results  Component Value Date   NA 141 02/20/2021   K 3.4 (L) 02/20/2021   CO2 26 02/20/2021   GLUCOSE 123 (H) 02/20/2021   BUN 11 02/20/2021   CREATININE 0.86 02/20/2021   BILITOT 0.8 02/20/2021   ALKPHOS 82 02/20/2021   AST 16 02/20/2021   ALT 11 02/20/2021   PROT 7.6 02/20/2021   ALBUMIN 4.1 02/20/2021   CALCIUM 9.0 02/20/2021   ANIONGAP 6 02/20/2021   No results found for: CHOL No results found for: HDL No results found for: LDLCALC No results found for: TRIG No results found for: CHOLHDL No results found for: HGBA1C    Assessment & Plan:   Problem List Items Addressed This Visit       Cardiovascular and Mediastinum   Hypertension      Patient denies any chest pain or shortness of breath there is no history of palpitation or paroxysmal nocturnal dyspnea   patient was advised to follow low-salt low-cholesterol diet    ideally I want to keep systolic blood pressure below 130 mmHg, patient was asked to check blood pressure one times a week and give me a report on that.  Patient will be follow-up in 3 months  or earlier as needed, patient will call me back for any change in the cardiovascular symptoms Patient was advised to buy a book from local bookstore concerning blood pressure and read several chapters  every day.  This will be supplemented by some of the material we will give him from the office.  Patient should also utilize other resources like YouTube and Internet to learn more about the blood pressure and the diet.      Atherosclerosis of aorta (HCC) - Primary    Hypercholesterolemia  I advised the patient to follow Mediterranean diet This diet is rich in fruits vegetables and whole grain, and This diet is also rich in fish and lean meat Patient should also eat a handful of almonds or walnuts daily Recent heart study indicated that average follow-up on this kind of diet reduces the cardiovascular mortality by 50 to 70%==        Respiratory   Seasonal allergic rhinitis due to pollen    Take Claritin 5 mg p.o. daily        Genitourinary   VAIN II (vaginal intraepithelial neoplasia grade II)    Refer to OB/GYN        Other   Metabolic syndrome    - I encouraged the patient to  lose weight.  - I educated them on making healthy dietary choices including eating more fruits and vegetables and less fried foods. - I encouraged the patient to exercise more, and educated on the benefits of exercise including weight loss, diabetes prevention, and hypertension prevention.   Dietary counseling with a registered dietician  Referral to a weight management support group (e.g. Weight Watchers, Overeaters Anonymous)  If  your BMI is greater than 29 or you have gained more than 15 pounds you should work on weight loss.  Attend a healthy cooking class        No orders of the defined types were placed in this encounter.   Follow-up: No follow-ups on file.    Cletis Athens, MD

## 2021-05-04 NOTE — Progress Notes (Signed)
Established Patient Office Visit  Subjective:  Patient ID: Kathleen Riddle, female    DOB: 04-Jul-1955  Age: 66 y.o. MRN: [id number]  CC:  Chief Complaint  Patient presents with   New Patient (Initial Visit)    HPI, patient is known to have vertigo dyspepsia history of tobacco abuse hyperglycemia hyperlipidemia is known to have coronary artery disease history of infective by CT scan has a history of Lacunar infarct of brain, patient is also known to have chronic obstructive asthma history of cervical dysplasia, she has a history of colonoscopy by GI in [facility], she has a history of hysterectomy treated for H. pylori in the past  Past Medical History:  Diagnosis Date   Abnormal Pap smear of cervix    Adrenal adenoma    Aortic atherosclerosis (HCC)    HGSIL (high grade squamous intraepithelial lesion) on Pap smear of cervix    Horseshoe kidney    Hypercholesteremia    Hypertension    Microscopic hematuria    VAIN II (vaginal intraepithelial neoplasia grade II)     Past Surgical History:  Procedure Laterality Date   COLONOSCOPY WITH PROPOFOL N/A 11/05/2019   Procedure: COLONOSCOPY WITH PROPOFOL;  Surgeon: Toledo, Benay Pike, MD;  Location: [facility];  Service: Gastroenterology;  Laterality: N/A;   COLPOSCOPY     ESOPHAGOGASTRODUODENOSCOPY (EGD) WITH PROPOFOL N/A 11/05/2019   Procedure: ESOPHAGOGASTRODUODENOSCOPY (EGD) WITH PROPOFOL;  Surgeon: Toledo, Benay Pike, MD;  Location: [facility];  Service: Gastroenterology;  Laterality: N/A;   PARTIAL HYSTERECTOMY      Family History  Problem Relation Age of Onset   Breast cancer Neg Hx    Bladder Cancer Neg Hx    Kidney cancer Neg Hx     Social History   Socioeconomic History   Marital status: Single    Spouse name: Not on file   Number of children: Not on file   Years of education: Not on file   Highest education level: Not on file  Occupational History   Not on file  Tobacco Use   Smoking status: Every Day     Packs/day: 0.50    Years: 46.00    Pack years: 23.00    Types: Cigarettes   Smokeless tobacco: Never  Vaping Use   Vaping Use: Never used  Substance and Sexual Activity   Alcohol use: No   Drug use: No   Sexual activity: Yes    Birth control/protection: None  Other Topics Concern   Not on file  Social History Narrative   ** Merged History Encounter **       ** Merged History Encounter **       Social Determinants of Health   Financial Resource Strain: Not on file  Food Insecurity: Not on file  Transportation Needs: Not on file  Physical Activity: Not on file  Stress: Not on file  Social Connections: Not on file  Intimate Partner Violence: Not on file     Current Outpatient Medications:    amLODipine (NORVASC) 2.5 MG tablet, Take by mouth., Disp: , Rfl:    aspirin 81 MG EC tablet, Take by mouth., Disp: , Rfl:    bisacodyl (DULCOLAX) 5 MG EC tablet, Take 5 mg by mouth daily as needed for moderate constipation., Disp: , Rfl:    citalopram (CELEXA) 20 MG tablet, Take 20 mg by mouth daily., Disp: , Rfl:    diclofenac sodium (VOLTAREN) 1 % GEL, Apply topically 4 (four) times daily., Disp: , Rfl:  ezetimibe (ZETIA) 10 MG tablet, Take 10 mg by mouth daily., Disp: , Rfl:    lactulose (CHRONULAC) 10 GM/15ML solution, Take by mouth 3 (three) times daily., Disp: , Rfl:    meclizine (ANTIVERT) 25 MG tablet, Take 1 tablet (25 mg total) by mouth 3 (three) times daily as needed for dizziness., Disp: 30 tablet, Rfl: 0   meloxicam (MOBIC) 7.5 MG tablet, , Disp: , Rfl:    triamcinolone cream (KENALOG) 0.1 %, Apply 1 application topically 2 (two) times daily., Disp: , Rfl:    pantoprazole (PROTONIX) 40 MG tablet, Take by mouth., Disp: , Rfl:    Allergies  Allergen Reactions   Atorvastatin     Other reaction(s): Liver Disorder Patient with cholestasis/elevated liver enzymes   Pravastatin     Other reaction(s): Muscle Pain    ROS Review of Systems  Constitutional: Negative.    HENT: Negative.    Eyes: Negative.   Respiratory: Negative.    Cardiovascular: Negative.   Gastrointestinal: Negative.   Endocrine: Negative.   Genitourinary: Negative.   Musculoskeletal: Negative.   Skin: Negative.   Allergic/Immunologic: Negative.   Neurological: Negative.   Hematological: Negative.   Psychiatric/Behavioral: Negative.    All other systems reviewed and are negative.    Objective:    Physical Exam Vitals reviewed.  Constitutional:      Appearance: Normal appearance.  HENT:     Mouth/Throat:     Mouth: Mucous membranes are moist.  Eyes:     Pupils: Pupils are equal, round, and reactive to light.  Neck:     Vascular: No carotid bruit.  Cardiovascular:     Rate and Rhythm: Normal rate and regular rhythm.     Pulses: Normal pulses.     Heart sounds: Normal heart sounds.  Pulmonary:     Effort: Pulmonary effort is normal.     Breath sounds: Normal breath sounds.  Abdominal:     General: Bowel sounds are normal.     Palpations: Abdomen is soft. There is no hepatomegaly, splenomegaly or mass.     Tenderness: There is no abdominal tenderness.     Hernia: No hernia is present.  Musculoskeletal:        General: No tenderness.     Cervical back: Neck supple.     Right lower leg: No edema.     Left lower leg: No edema.  Skin:    Findings: No rash.  Neurological:     Mental Status: She is alert and oriented to person, place, and time.     Motor: No weakness.  Psychiatric:        Mood and Affect: Mood and affect normal.        Behavior: Behavior normal.    BP 130/74    Pulse 63    Ht 5\' 2"  (1.575 m)    Wt 154 lb 3.2 oz (69.9 kg)    BMI 28.20 kg/m  Wt Readings from Last 3 Encounters:  05/04/21 154 lb 3.2 oz (69.9 kg)  02/20/21 154 lb (69.9 kg)  10/27/20 150 lb (68 kg)     Health Maintenance Due  Topic Date Due   HIV Screening  Never done   Hepatitis C Screening  Never done   Zoster Vaccines- Shingrix (1 of 2) Never done   INFLUENZA VACCINE   Never done    There are no preventive care reminders to display for this patient.  No results found for: TSH Lab Results  Component Value Date  WBC 7.3 02/20/2021   HGB 12.9 02/20/2021   HCT 40.7 02/20/2021   MCV 90.4 02/20/2021   PLT 290 02/20/2021   Lab Results  Component Value Date   NA 141 02/20/2021   K 3.4 (L) 02/20/2021   CO2 26 02/20/2021   GLUCOSE 123 (H) 02/20/2021   BUN 11 02/20/2021   CREATININE 0.86 02/20/2021   BILITOT 0.8 02/20/2021   ALKPHOS 82 02/20/2021   AST 16 02/20/2021   ALT 11 02/20/2021   PROT 7.6 02/20/2021   ALBUMIN 4.1 02/20/2021   CALCIUM 9.0 02/20/2021   ANIONGAP 6 02/20/2021   No results found for: CHOL No results found for: HDL No results found for: LDLCALC No results found for: TRIG No results found for: CHOLHDL No results found for: HGBA1C    Assessment & Plan:   Problem List Items Addressed This Visit       Cardiovascular and Mediastinum   Hypertension     Patient denies any chest pain or shortness of breath there is no history of palpitation or paroxysmal nocturnal dyspnea   patient was advised to follow low-salt low-cholesterol diet    ideally I want to keep systolic blood pressure below 130 mmHg, patient was asked to check blood pressure one times a week and give me a report on that.  Patient will be follow-up in 3 months  or earlier as needed, patient will call me back for any change in the cardiovascular symptoms Patient was advised to buy a book from local bookstore concerning blood pressure and read several chapters  every day.  This will be supplemented by some of the material we will give him from the office.  Patient should also utilize other resources like YouTube and Internet to learn more about the blood pressure and the diet.      Atherosclerosis of aorta (HCC) - Primary    Hypercholesterolemia  I advised the patient to follow Mediterranean diet This diet is rich in fruits vegetables and whole grain, and This  diet is also rich in fish and lean meat Patient should also eat a handful of almonds or walnuts daily Recent heart study indicated that average follow-up on this kind of diet reduces the cardiovascular mortality by 50 to 70%==        Respiratory   Seasonal allergic rhinitis due to pollen    Take Claritin 5 mg p.o. daily        Genitourinary   VAIN II (vaginal intraepithelial neoplasia grade II)    Refer to OB/GYN      Relevant Orders   Ambulatory referral to Obstetrics / Gynecology     Other   Metabolic syndrome    - I encouraged the patient to lose weight.  - I educated them on making healthy dietary choices including eating more fruits and vegetables and less fried foods. - I encouraged the patient to exercise more, and educated on the benefits of exercise including weight loss, diabetes prevention, and hypertension prevention.   Dietary counseling with a registered dietician  Referral to a weight management support group (e.g. Weight Watchers, Overeaters Anonymous)  If your BMI is greater than 29 or you have gained more than 15 pounds you should work on weight loss.  Attend a healthy cooking class       Other Visit Diagnoses     Encounter for screening mammogram for malignant neoplasm of breast       Relevant Orders   MM 3D SCREEN BREAST BILATERAL  50 to 70%==       No orders of the defined types were placed in this encounter.   Follow-up: No follow-ups on file.    Cletis Athens, MD

## 2021-06-22 ENCOUNTER — Encounter: Payer: 59 | Admitting: Obstetrics and Gynecology

## 2021-06-22 DIAGNOSIS — N891 Moderate vaginal dysplasia: Secondary | ICD-10-CM

## 2021-06-22 DIAGNOSIS — Z7689 Persons encountering health services in other specified circumstances: Secondary | ICD-10-CM

## 2021-07-28 ENCOUNTER — Ambulatory Visit: Payer: 59 | Admitting: Nurse Practitioner

## 2021-09-12 ENCOUNTER — Other Ambulatory Visit: Payer: Self-pay | Admitting: Internal Medicine

## 2021-09-12 DIAGNOSIS — Z1231 Encounter for screening mammogram for malignant neoplasm of breast: Secondary | ICD-10-CM

## 2021-09-13 ENCOUNTER — Ambulatory Visit: Payer: Medicaid Other | Admitting: Licensed Practical Nurse

## 2021-09-23 ENCOUNTER — Encounter: Payer: Self-pay | Admitting: Licensed Practical Nurse

## 2021-09-23 ENCOUNTER — Ambulatory Visit (INDEPENDENT_AMBULATORY_CARE_PROVIDER_SITE_OTHER): Payer: 59 | Admitting: Licensed Practical Nurse

## 2021-09-23 ENCOUNTER — Other Ambulatory Visit (HOSPITAL_COMMUNITY)
Admission: RE | Admit: 2021-09-23 | Discharge: 2021-09-23 | Disposition: A | Discharge status: Home / Self Care | Payer: 59 | Source: Ambulatory Visit | Attending: Licensed Practical Nurse | Admitting: Licensed Practical Nurse

## 2021-09-23 VITALS — BP 120/60 | Ht 62.0 in | Wt 159.0 lb

## 2021-09-23 DIAGNOSIS — Z124 Encounter for screening for malignant neoplasm of cervix: Secondary | ICD-10-CM | POA: Insufficient documentation

## 2021-09-23 DIAGNOSIS — Z01419 Encounter for gynecological examination (general) (routine) without abnormal findings: Secondary | ICD-10-CM

## 2021-09-23 NOTE — Progress Notes (Unsigned)
Gynecology Annual Exam  PCP: Adin Hector, MD  Chief Complaint:  Chief Complaint  Patient presents with   Gynecologic Exam    History of Present Illness:Patient is a 66 y.o. G3P3001 presents for annual exam. The patient has no complaints today.   LMP: No LMP recorded. Patient has had a hysterectomy.  The patient is not sexually active. She denies dyspareunia.  The patient does perform self breast exams.  There is no notable family history of breast or ovarian cancer in her family.  The patient wears seatbelts: yes.   The patient has regular exercise: yes.  Walking   The patient denies current symptoms of depression.   Currently being treated for Anxiety  Works for [employer] stress as "good" Lives with adult grandchild, feels safe,    Review of Systems: Review of Systems  Constitutional: Negative.   Respiratory:  Positive for cough.   Cardiovascular: Negative.   Gastrointestinal:  Positive for constipation.  Genitourinary: Negative.   Musculoskeletal: Negative.   Skin: Negative.   Neurological: Negative.   Psychiatric/Behavioral: Negative.      Past Medical History:  Patient Active Problem List   Diagnosis Date Noted   Hypertension 05/04/2021   Atherosclerosis of aorta ([place]) 05/04/2021   Seasonal allergic rhinitis due to pollen 83/33/8329   Metabolic syndrome 19/16/6060   VAIN II (vaginal intraepithelial neoplasia grade II) 10/03/2016    10/20/2019 Colposcopy VAIN I 08/12/2019 LGSIL HPV positive (16/18 negative) 06/20/2018 Colposcopy VAIN I at 9 O'Clock 05/03/2018 LGSIL pap HPV positive 11/01/2016 Colposcopy negative 10/03/2016 Pap LGSIL HPV positive 02/18/2015 Colposcopy VAIN 2 at 9 O'Clock biopsy 08/07/2014 Colposcopy VAIN 2 at 9 O'Clock biopsy 06/18/2014 Pap LGSIL  11/21/2013 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 at 12 to 3 O'Clock 10/09/2013 Colposcopy CIN II at 2 O;Clock, ECC HGSIL 09/14/2013 Pap LGSIL      Past Surgical History:  Past  Surgical History:  Procedure Laterality Date   COLONOSCOPY WITH PROPOFOL N/A 11/05/2019   Procedure: COLONOSCOPY WITH PROPOFOL;  Surgeon: Toledo, Benay Pike, MD;  Location: [facility];  Service: Gastroenterology;  Laterality: N/A;   COLPOSCOPY     ESOPHAGOGASTRODUODENOSCOPY (EGD) WITH PROPOFOL N/A 11/05/2019   Procedure: ESOPHAGOGASTRODUODENOSCOPY (EGD) WITH PROPOFOL;  Surgeon: Toledo, Benay Pike, MD;  Location: [facility];  Service: Gastroenterology;  Laterality: N/A;   PARTIAL HYSTERECTOMY      Gynecologic History:  No LMP recorded. Patient has had a hysterectomy. Last Pap: Results were: 08/2020 low-grade squamous intraepithelial neoplasia (LGSIL - encompassing HPV,mild dysplasia,CIN I)  Last mammogram: 10/2020 Results were: BI-RAD I  Obstetric History: G3P3001  Family History:  Family History  Problem Relation Age of Onset   Breast cancer Neg Hx    Bladder Cancer Neg Hx    Kidney cancer Neg Hx     Social History:  Social History   Socioeconomic History   Marital status: Single    Spouse name: Not on file   Number of children: Not on file   Years of education: Not on file   Highest education level: Not on file  Occupational History   Not on file  Tobacco Use   Smoking status: Every Day    Packs/day: 0.50    Years: 46.00    Total pack years: 23.00    Types: Cigarettes   Smokeless tobacco: Never  Vaping Use   Vaping Use: Never used  Substance and Sexual Activity   Alcohol use: No   Drug use: No  Sexual activity: Yes    Birth control/protection: None  Other Topics Concern   Not on file  Social History Narrative   ** Merged History Encounter **       ** Merged History Encounter **       Social Determinants of Health   Financial Resource Strain: Not on file  Food Insecurity: Not on file  Transportation Needs: Not on file  Physical Activity: Not on file  Stress: Not on file  Social Connections: Not on file  Intimate Partner Violence: Not on file     Allergies:  Allergies  Allergen Reactions   Atorvastatin     Other reaction(s): Liver Disorder Patient with cholestasis/elevated liver enzymes   Pravastatin     Other reaction(s): Muscle Pain    Medications: Prior to Admission medications   Medication Sig Start Date End Date Taking? Authorizing Provider  amLODipine (NORVASC) 2.5 MG tablet Take by mouth. 05/05/16  Yes [provider]  aspirin 81 MG EC tablet Take by mouth.   Yes [provider]  bisacodyl (DULCOLAX) 5 MG EC tablet Take 5 mg by mouth daily as needed for moderate constipation.   Yes [provider]  citalopram (CELEXA) 20 MG tablet Take 20 mg by mouth daily. 04/22/21  Yes [provider]  ezetimibe (ZETIA) 10 MG tablet Take 10 mg by mouth daily. 07/14/20  Yes [provider]  lactulose (CHRONULAC) 10 GM/15ML solution Take by mouth 3 (three) times daily.   Yes [provider]  meclizine (ANTIVERT) 25 MG tablet Take 1 tablet (25 mg total) by mouth 3 (three) times daily as needed for dizziness. 02/20/21  Yes Blake Divine, MD  meloxicam [facility]) 7.5 MG tablet  01/19/15  Yes [provider]  triamcinolone cream (KENALOG) 0.1 % Apply 1 application topically 2 (two) times daily.   Yes [provider]  diclofenac sodium (VOLTAREN) 1 % GEL Apply topically 4 (four) times daily. Patient not taking: Reported on 09/23/2021    [provider]  pantoprazole (PROTONIX) 40 MG tablet Take by mouth. 03/17/20 03/17/21  [provider]    Physical Exam Vitals: Blood pressure 120/60, height '5\' 2"'$  (1.575 m), weight 159 lb (72.1 kg).  General: NAD HEENT: normocephalic, anicteric Thyroid: no enlargement, no palpable nodules Pulmonary: No increased work of breathing, CTAB Cardiovascular: RRR, distal pulses 2+ Breast: Breast symmetrical, no tenderness, no palpable nodules or masses, no skin or nipple retraction present, no nipple discharge.  No axillary  or supraclavicular lymphadenopathy. Abdomen: NABS, soft, non-tender, non-distended.  Umbilicus without lesions.  No hepatomegaly, splenomegaly or masses palpable. No evidence of hernia  Genitourinary:  External: Normal external female genitalia.  Normal urethral meatus, normal Bartholin's and Skene's glands.    Vagina: Normal vaginal mucosa, no evidence of prolapse, good tone  Cervix: surgically absent  Uterus: surgically absent    Adnexa: ovaries non-enlarged, no adnexal masses  Rectal: deferred  Lymphatic: no evidence of inguinal lymphadenopathy Extremities: no edema, erythema, or tenderness Neurologic: Grossly intact Psychiatric: mood appropriate, affect full       Assessment: 66 y.o. G3P3001 routine annual exam  Plan: Problem List Items Addressed This Visit   None Visit Diagnoses     Well woman exam    -  Primary   Relevant Orders   Cytology - PAP   Cervical cancer screening       Relevant Orders   Cytology - PAP       1) Mammogram - recommend yearly screening mammogram.  Mammogram Is up to date  2) STI screening  was notoffered and therefore not obtained  3) ASCCP guidelines and rational discussed.  Patient opts for yearly screening interval  4) Osteoporosis to be managed by PCP  - per USPTF routine screening DEXA at age 61   Consider FDA-approved medical therapies in postmenopausal women and men aged 56 years and older, based on the following: a) A hip or vertebral (clinical or morphometric) fracture b) T-score ? -2.5 at the femoral neck or spine after appropriate evaluation to exclude secondary causes C) Low bone mass (T-score between -1.0 and -2.5 at the femoral neck or spine) and a 10-year probability of a hip fracture ? 3% or a 10-year probability of a major osteoporosis-related fracture ? 20% based on the [place]-adapted WHO algorithm   5) Routine healthcare maintenance including cholesterol, diabetes screening discussed managed by PCP  6) Colonoscopy  completed in 2021 .  Screening recommended starting at age 72 for average risk individuals, age 65 for individuals deemed at increased risk (including African Americans) and recommended to continue until age 22.  For patient age 46-85 individualized approach is recommended.  Gold standard screening is via colonoscopy, Cologuard screening is an acceptable alternative for patient unwilling or unable to undergo colonoscopy.  "Colorectal cancer screening for average?risk adults: 2018 guideline update from the American Cancer Society"[place]: A Cancer Journal for Clinicians: Sep 06, 2016   7) No follow-ups on file.   Roberto Scales, [facility] 09/23/2021, 9:00 AM

## 2021-09-27 LAB — CYTOLOGY - PAP
Comment: NEGATIVE
Comment: NEGATIVE
Comment: NEGATIVE
HPV 16: NEGATIVE
HPV 18 / 45: NEGATIVE
High risk HPV: POSITIVE — AB

## 2021-10-13 ENCOUNTER — Ambulatory Visit
Admission: RE | Admit: 2021-10-13 | Discharge: 2021-10-13 | Disposition: A | Discharge status: Home / Self Care | Payer: 59 | Source: Ambulatory Visit | Attending: Internal Medicine | Admitting: Internal Medicine

## 2021-10-13 DIAGNOSIS — Z1231 Encounter for screening mammogram for malignant neoplasm of breast: Secondary | ICD-10-CM | POA: Diagnosis present

## 2021-10-27 ENCOUNTER — Ambulatory Visit
Admission: RE | Admit: 2021-10-27 | Discharge: 2021-10-27 | Disposition: A | Discharge status: Home / Self Care | Payer: 59 | Source: Ambulatory Visit | Attending: Internal Medicine | Admitting: Internal Medicine

## 2021-10-27 DIAGNOSIS — F17200 Nicotine dependence, unspecified, uncomplicated: Secondary | ICD-10-CM

## 2021-10-27 DIAGNOSIS — Z87891 Personal history of nicotine dependence: Secondary | ICD-10-CM | POA: Insufficient documentation

## 2021-10-27 DIAGNOSIS — J439 Emphysema, unspecified: Secondary | ICD-10-CM | POA: Insufficient documentation

## 2021-10-27 DIAGNOSIS — I700 Atherosclerosis of aorta: Secondary | ICD-10-CM | POA: Insufficient documentation

## 2021-10-27 DIAGNOSIS — Z122 Encounter for screening for malignant neoplasm of respiratory organs: Secondary | ICD-10-CM | POA: Diagnosis not present

## 2021-10-31 ENCOUNTER — Other Ambulatory Visit: Payer: Self-pay

## 2021-10-31 DIAGNOSIS — Z87891 Personal history of nicotine dependence: Secondary | ICD-10-CM

## 2021-10-31 DIAGNOSIS — F17210 Nicotine dependence, cigarettes, uncomplicated: Secondary | ICD-10-CM

## 2021-10-31 DIAGNOSIS — F17200 Nicotine dependence, unspecified, uncomplicated: Secondary | ICD-10-CM

## 2021-10-31 DIAGNOSIS — Z122 Encounter for screening for malignant neoplasm of respiratory organs: Secondary | ICD-10-CM

## 2021-11-06 ENCOUNTER — Telehealth: Payer: Self-pay | Admitting: Licensed Practical Nurse

## 2021-11-06 NOTE — Telephone Encounter (Signed)
Reviewed pap with Dr Marcelline Mates, LGSIL with HPV positive Rec colpo  Pt called made aware of plan,  the office will call her tomorrow to schedule a colpo Surveyor, quantity messaged to call pt in the morning Roberto Scales, CNM  Mosetta Pigeon, [place]  '@TODAY'$ @  7:11 PM

## 2021-11-28 ENCOUNTER — Other Ambulatory Visit (HOSPITAL_COMMUNITY)
Admission: RE | Admit: 2021-11-28 | Discharge: 2021-11-28 | Disposition: A | Discharge status: Home / Self Care | Payer: 59 | Source: Ambulatory Visit | Attending: Obstetrics & Gynecology | Admitting: Obstetrics & Gynecology

## 2021-11-28 ENCOUNTER — Ambulatory Visit (INDEPENDENT_AMBULATORY_CARE_PROVIDER_SITE_OTHER): Payer: 59 | Admitting: Obstetrics & Gynecology

## 2021-11-28 ENCOUNTER — Ambulatory Visit: Payer: 59 | Admitting: Obstetrics & Gynecology

## 2021-11-28 VITALS — BP 136/88 | Ht 62.0 in

## 2021-11-28 DIAGNOSIS — R87622 Low grade squamous intraepithelial lesion on cytologic smear of vagina (LGSIL): Secondary | ICD-10-CM | POA: Diagnosis present

## 2021-11-28 DIAGNOSIS — N870 Mild cervical dysplasia: Secondary | ICD-10-CM | POA: Diagnosis not present

## 2021-11-28 MED ORDER — MAGNESIUM CITRATE PO SOLN: 1.0000 | Freq: Once | ORAL | 0 refills | Status: DC

## 2021-11-28 NOTE — Progress Notes (Signed)
   Subjective:    Patient ID: Kathleen Riddle, female    DOB: [birth year], 66 y.o.   MRN: [id number]  HPI 66 yo P1 here for a colposcopy of her vagina. She has a long h/o cervical dysplasia, then had a hysterectomy and now has continued abnormal paps of her cuff, with VAIN seen on biopsy.  10/20/2019 Colposcopy VAIN I 08/12/2019 LGSIL HPV positive (16/18 negative) 06/20/2018 Colposcopy VAIN I at 9 O'Clock 05/03/2018 LGSIL pap HPV positive 11/01/2016 Colposcopy negative 10/03/2016 Pap LGSIL HPV positive 02/18/2015 Colposcopy VAIN 2 at 9 O'Clock biopsy 08/07/2014 Colposcopy VAIN 2 at 9 O'Clock biopsy 06/18/2014 Pap LGSIL  11/21/2013 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 at 12 to 3 O'Clock 10/09/2013 Colposcopy CIN II at 2 O;Clock, ECC HGSIL 09/14/2013 Pap LGSIL      Review of Systems She has been abstinent since "the pandemic".    Objective:   Physical Exam Well nourished, well hydrated Black female, no apparent distress She is ambulating and conversing normally. Pederson spec used to visualize her vagina. No abnormalities prior to application of acetic acid. Acetic acid allowed to sit in vagina for about 2 minutes. There was a 2 x 1 cm area of the right vaginal fornix that is white and thickened. I biopsied the area and used silver nitrate to achieve hemostasis. She tolerated the procedure well.     Assessment & Plan:   LGSIL pap of vaginal cuff  Await biopsy results.

## 2021-11-30 LAB — SURGICAL PATHOLOGY

## 2021-12-09 ENCOUNTER — Encounter: Payer: Self-pay | Admitting: Obstetrics & Gynecology

## 2022-09-12 ENCOUNTER — Other Ambulatory Visit: Payer: Self-pay | Admitting: Internal Medicine

## 2022-09-12 DIAGNOSIS — Z1231 Encounter for screening mammogram for malignant neoplasm of breast: Secondary | ICD-10-CM

## 2022-09-20 ENCOUNTER — Other Ambulatory Visit: Payer: Self-pay | Admitting: Acute Care

## 2022-09-20 DIAGNOSIS — Z87891 Personal history of nicotine dependence: Secondary | ICD-10-CM

## 2022-09-20 DIAGNOSIS — F17200 Nicotine dependence, unspecified, uncomplicated: Secondary | ICD-10-CM

## 2022-09-20 DIAGNOSIS — F17210 Nicotine dependence, cigarettes, uncomplicated: Secondary | ICD-10-CM

## 2022-09-20 DIAGNOSIS — Z122 Encounter for screening for malignant neoplasm of respiratory organs: Secondary | ICD-10-CM

## 2022-10-26 ENCOUNTER — Ambulatory Visit
Admission: RE | Admit: 2022-10-26 | Discharge: 2022-10-26 | Disposition: A | Discharge status: Home / Self Care | Payer: 59 | Source: Ambulatory Visit | Attending: Internal Medicine | Admitting: Internal Medicine

## 2022-10-26 DIAGNOSIS — Z1231 Encounter for screening mammogram for malignant neoplasm of breast: Secondary | ICD-10-CM | POA: Insufficient documentation

## 2022-10-30 ENCOUNTER — Ambulatory Visit: Payer: 59

## 2022-11-03 ENCOUNTER — Ambulatory Visit
Admission: RE | Admit: 2022-11-03 | Discharge: 2022-11-03 | Disposition: A | Discharge status: Home / Self Care | Payer: 59 | Source: Ambulatory Visit | Attending: Acute Care | Admitting: Acute Care

## 2022-11-03 DIAGNOSIS — F17210 Nicotine dependence, cigarettes, uncomplicated: Secondary | ICD-10-CM | POA: Insufficient documentation

## 2022-11-03 DIAGNOSIS — F17200 Nicotine dependence, unspecified, uncomplicated: Secondary | ICD-10-CM | POA: Diagnosis present

## 2022-11-03 DIAGNOSIS — Z87891 Personal history of nicotine dependence: Secondary | ICD-10-CM | POA: Diagnosis present

## 2022-11-03 DIAGNOSIS — Z122 Encounter for screening for malignant neoplasm of respiratory organs: Secondary | ICD-10-CM | POA: Insufficient documentation

## 2022-11-13 ENCOUNTER — Other Ambulatory Visit: Payer: Self-pay | Admitting: Acute Care

## 2022-11-13 DIAGNOSIS — Z87891 Personal history of nicotine dependence: Secondary | ICD-10-CM

## 2022-11-13 DIAGNOSIS — Z122 Encounter for screening for malignant neoplasm of respiratory organs: Secondary | ICD-10-CM

## 2022-11-13 DIAGNOSIS — F17210 Nicotine dependence, cigarettes, uncomplicated: Secondary | ICD-10-CM

## 2022-12-22 ENCOUNTER — Ambulatory Visit (INDEPENDENT_AMBULATORY_CARE_PROVIDER_SITE_OTHER): Payer: 59 | Admitting: Licensed Practical Nurse

## 2022-12-22 ENCOUNTER — Other Ambulatory Visit (HOSPITAL_COMMUNITY)
Admission: RE | Admit: 2022-12-22 | Discharge: 2022-12-22 | Disposition: A | Discharge status: Home / Self Care | Payer: 59 | Source: Ambulatory Visit | Attending: Licensed Practical Nurse | Admitting: Licensed Practical Nurse

## 2022-12-22 VITALS — BP 125/68 | HR 67 | Ht 62.0 in | Wt 155.4 lb

## 2022-12-22 DIAGNOSIS — Z1151 Encounter for screening for human papillomavirus (HPV): Secondary | ICD-10-CM | POA: Insufficient documentation

## 2022-12-22 DIAGNOSIS — Z124 Encounter for screening for malignant neoplasm of cervix: Secondary | ICD-10-CM

## 2022-12-22 DIAGNOSIS — Z01419 Encounter for gynecological examination (general) (routine) without abnormal findings: Secondary | ICD-10-CM | POA: Insufficient documentation

## 2022-12-22 NOTE — Progress Notes (Unsigned)
Gynecology Annual Exam  PCP: Lynnea Ferrier, MD  Chief Complaint:  Chief Complaint  Patient presents with   Gynecologic Exam    History of Present Illness:Patient is a 67 y.o. G3P3001 presents for annual exam. The patient has no complaints today.   LMP: No LMP recorded. Patient has had a hysterectomy. Menarche:{numbers 0-63:01601} Average Interval: {Desc; regular/irreg:14544}, {numbers 22-35:14824} days Duration of flow: {numbers; 0-10:33138} days Heavy Menses: {yes/no:63} Clots: {yes/no:63} Intermenstrual Bleeding: {yes/no:63} Postcoital Bleeding: {yes/no:63} Dysmenorrhea: {yes/no:63}  The patient {sys sexually active:13135} sexually active. She {has/denies:315300} dyspareunia.  The patient {DOES_DOES UXN:23557} perform self breast exams.  There {is/is no:19420} notable family history of breast or ovarian cancer in her family.  The patient wears seatbelts: {yes/no:63}.   The patient has regular exercise: {yes/no/not asked:9010}.    The patient {Blank single:19197::"reports","denies"} current symptoms of depression.     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     10/20/2019 Colposcopy VAIN I 08/12/2019 LGSIL HPV positive (16/18 negative) 06/20/2018 Colposcopy VAIN I at 9 O'Clock 05/03/2018 LGSIL pap HPV positive 11/01/2016 Colposcopy negative 10/03/2016 Pap LGSIL HPV positive 02/18/2015 Colposcopy VAIN 2 at 9 O'Clock biopsy 08/07/2014 Colposcopy VAIN 2 at 9 O'Clock biopsy 06/18/2014 Pap LGSIL  11/21/2013 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 at 12 to 3 O'Clock 10/09/2013 Colposcopy CIN II at 2 O;Clock, ECC HGSIL 09/14/2013 Pap LGSIL      Past Surgical History:  Past  Surgical History:  Procedure Laterality Date   COLONOSCOPY WITH PROPOFOL N/A 11/05/2019   Procedure: COLONOSCOPY WITH PROPOFOL;  Surgeon: Toledo, Boykin Nearing, MD;  Location: [facility];  Service: Gastroenterology;  Laterality: N/A;   COLPOSCOPY     ESOPHAGOGASTRODUODENOSCOPY (EGD) WITH PROPOFOL N/A 11/05/2019   Procedure: ESOPHAGOGASTRODUODENOSCOPY (EGD) WITH PROPOFOL;  Surgeon: Toledo, Boykin Nearing, MD;  Location: [facility];  Service: Gastroenterology;  Laterality: N/A;   PARTIAL HYSTERECTOMY      Gynecologic History:  No LMP recorded. Patient has had a hysterectomy. Last Pap: Results were: *** {Findings; lab pap smear results:16707::"NIL and HR HPV+","NIL and HR HPV negative"}  Last mammogram: *** Results were: {Blank single:19197::"***","BI-RADS IV","BI-RAD III","BI-RAD II","BI-RAD I"}  Obstetric History: G3P3001  Family History:  Family History  Problem Relation Age of Onset   Breast cancer Neg Hx    Bladder Cancer Neg Hx    Kidney cancer Neg Hx     Social History:  Social History   Socioeconomic History   Marital status: Single    Spouse name: Not on file   Number of children: Not on file   Years of education: Not on file   Highest education level: Not on file  Occupational History   Not on file  Tobacco Use   Smoking status: Every Day    Current packs/day: 0.50    Average packs/day: 0.5 packs/day for 46.0 years (23.0 ttl pk-yrs)    Types: Cigarettes   Smokeless tobacco: Never  Vaping Use   Vaping status: Never Used  Substance and Sexual Activity   Alcohol use: No   Drug use: No   Sexual activity: Yes    Birth control/protection: None  Other Topics Concern   Not on file  Social  History Narrative   ** Merged History Encounter **       ** Merged History Encounter **       Social Determinants of Corporate investment banker Strain: Not on file  Food Insecurity: Not on file  Transportation Needs: Not on file  Physical Activity: Not on file  Stress: Not  on file  Social Connections: Not on file  Intimate Partner Violence: Not on file    Allergies:  Allergies  Allergen Reactions   Atorvastatin     Other reaction(s): Liver Disorder Patient with cholestasis/elevated liver enzymes   Pravastatin     Other reaction(s): Muscle Pain    Medications: Prior to Admission medications   Medication Sig Start Date End Date Taking? Authorizing Provider  amLODipine (NORVASC) 2.5 MG tablet Take by mouth. 05/05/16  Yes [provider]  aspirin 81 MG EC tablet Take by mouth.   Yes [provider]  bisacodyl (DULCOLAX) 5 MG EC tablet Take 5 mg by mouth daily as needed for moderate constipation.   Yes [provider]  citalopram (CELEXA) 20 MG tablet Take 20 mg by mouth daily. 04/22/21  Yes [provider]  ezetimibe (ZETIA) 10 MG tablet Take 10 mg by mouth daily. 07/14/20  Yes [provider]  lactulose (CHRONULAC) 10 GM/15ML solution Take by mouth 3 (three) times daily.   Yes [provider]  meclizine (ANTIVERT) 25 MG tablet Take 1 tablet (25 mg total) by mouth 3 (three) times daily as needed for dizziness. 02/20/21  Yes Chesley Noon, MD  meloxicam [facility]) 7.5 MG tablet  01/19/15  Yes [provider]  triamcinolone cream (KENALOG) 0.1 % Apply 1 application topically 2 (two) times daily.   Yes [provider]  diclofenac sodium (VOLTAREN) 1 % GEL Apply topically 4 (four) times daily. Patient not taking: Reported on 09/23/2021    [provider]  pantoprazole (PROTONIX) 40 MG tablet Take by mouth. 03/17/20 03/17/21  [provider]    Physical Exam Vitals: Blood pressure 125/68, pulse 67, height 5\' 2"  (1.575 m), weight 155 lb 6.4 oz (70.5 kg).  General: NAD HEENT: normocephalic, anicteric Thyroid: no enlargement, no palpable nodules Pulmonary: No increased work of breathing, CTAB Cardiovascular: RRR, distal pulses 2+ Breast: Breast symmetrical, no tenderness, no  palpable nodules or masses, no skin or nipple retraction present, no nipple discharge.  No axillary or supraclavicular lymphadenopathy. Abdomen: NABS, soft, non-tender, non-distended.  Umbilicus without lesions.  No hepatomegaly, splenomegaly or masses palpable. No evidence of hernia  Genitourinary:  External: Normal external female genitalia.  Normal urethral meatus, normal Bartholin's and Skene's glands.    Vagina: Normal vaginal mucosa, no evidence of prolapse.    Cervix: Grossly normal in appearance, no bleeding  Uterus: Non-enlarged, mobile, normal contour.  No CMT  Adnexa: ovaries non-enlarged, no adnexal masses  Rectal: deferred  Lymphatic: no evidence of inguinal lymphadenopathy Extremities: no edema, erythema, or tenderness Neurologic: Grossly intact Psychiatric: mood appropriate, affect full  Female chaperone present for pelvic and breast  portions of the physical exam     Assessment: 67 y.o. G3P3001 routine annual exam  Plan: Problem List Items Addressed This Visit   None Visit Diagnoses     Well woman exam    -  Primary   Relevant Orders   Cytology - PAP   DG BONE DENSITY (DXA)   Cervical cancer screening       Relevant Orders   Cytology - PAP  1) Mammogram - recommend yearly screening mammogram.  Mammogram {Blank single:19197::"Is up to date","Was ordered today"}  2) STI screening  {Blank single:19197::"was","was not"}offered and {Blank single:19197::"accepted","declined","therefore not obtained"}  3) ASCCP guidelines and rational discussed.  Patient opts for {Blank single:19197::"***","every 5 years","every 3 years","yearly","discontinue age >65","discontinue secondary to prior hysterectomy"} screening interval  4) Osteoporosis  - per USPTF routine screening DEXA at age 78 - FRAX 10 year major fracture risk ***,  10 year hip fracture risk ***  Consider FDA-approved medical therapies in postmenopausal women and men aged 31 years and older, based on the  following: a) A hip or vertebral (clinical or morphometric) fracture b) T-score <= -2.5 at the femoral neck or spine after appropriate evaluation to exclude secondary causes C) Low bone mass (T-score between -1.0 and -2.5 at the femoral neck or spine) and a 10-year probability of a hip fracture >= 3% or a 10-year probability of a major osteoporosis-related fracture >= 20% based on the [place]-adapted WHO algorithm   5) Routine healthcare maintenance including cholesterol, diabetes screening discussed {Blank single:19197::"managed by PCP","Ordered today","To return fasting at a later date","Declines"}  6) Colonoscopy ***.  Screening recommended starting at age 51 for average risk individuals, age 74 for individuals deemed at increased risk (including African Americans) and recommended to continue until age 17.  For patient age 40-85 individualized approach is recommended.  Gold standard screening is via colonoscopy, Cologuard screening is an acceptable alternative for patient unwilling or unable to undergo colonoscopy.  "Colorectal cancer screening for average?risk adults: 2018 guideline update from the American Cancer Society"CA: A Cancer Journal for Clinicians: Sep 06, 2016   7) No follow-ups on file.   Carie Caddy, CNM  [facility], [facility] 12/22/2022, 1:49 PM

## 2022-12-29 LAB — CYTOLOGY - PAP
Comment: NEGATIVE
Comment: NEGATIVE
Comment: NEGATIVE
HPV 16: NEGATIVE
HPV 18 / 45: NEGATIVE
High risk HPV: POSITIVE — AB

## 2023-01-03 ENCOUNTER — Other Ambulatory Visit: Payer: Self-pay | Admitting: Licensed Practical Nurse

## 2023-09-27 ENCOUNTER — Other Ambulatory Visit: Payer: Self-pay | Admitting: Internal Medicine

## 2023-09-27 DIAGNOSIS — Z1231 Encounter for screening mammogram for malignant neoplasm of breast: Secondary | ICD-10-CM

## 2023-10-30 ENCOUNTER — Ambulatory Visit
Admission: RE | Admit: 2023-10-30 | Discharge: 2023-10-30 | Disposition: A | Discharge status: Home / Self Care | Source: Ambulatory Visit | Attending: Internal Medicine | Admitting: Internal Medicine

## 2023-10-30 DIAGNOSIS — Z1231 Encounter for screening mammogram for malignant neoplasm of breast: Secondary | ICD-10-CM | POA: Insufficient documentation

## 2023-11-05 ENCOUNTER — Ambulatory Visit
Admission: RE | Admit: 2023-11-05 | Discharge: 2023-11-05 | Disposition: A | Discharge status: Home / Self Care | Source: Ambulatory Visit | Attending: Acute Care | Admitting: Acute Care

## 2023-11-05 DIAGNOSIS — F17210 Nicotine dependence, cigarettes, uncomplicated: Secondary | ICD-10-CM | POA: Diagnosis present

## 2023-11-05 DIAGNOSIS — Z87891 Personal history of nicotine dependence: Secondary | ICD-10-CM | POA: Insufficient documentation

## 2023-11-05 DIAGNOSIS — Z122 Encounter for screening for malignant neoplasm of respiratory organs: Secondary | ICD-10-CM | POA: Insufficient documentation

## 2023-11-12 ENCOUNTER — Other Ambulatory Visit: Payer: Self-pay | Admitting: Acute Care

## 2023-11-12 DIAGNOSIS — F17210 Nicotine dependence, cigarettes, uncomplicated: Secondary | ICD-10-CM

## 2023-11-12 DIAGNOSIS — Z87891 Personal history of nicotine dependence: Secondary | ICD-10-CM

## 2023-11-12 DIAGNOSIS — Z122 Encounter for screening for malignant neoplasm of respiratory organs: Secondary | ICD-10-CM

## 2023-12-14 ENCOUNTER — Telehealth: Payer: Self-pay | Admitting: Licensed Practical Nurse

## 2023-12-14 NOTE — Telephone Encounter (Signed)
 Reached out to pt bc she is due for a repeat pap per LMD.  Left message for pt to call back to schedule.

## 2024-01-02 NOTE — Telephone Encounter (Signed)
 Pt has been scheduled with LMD for a repeat pap smear on 01/21/2024.

## 2024-01-21 ENCOUNTER — Other Ambulatory Visit (HOSPITAL_COMMUNITY)
Admission: RE | Admit: 2024-01-21 | Discharge: 2024-01-21 | Disposition: A | Source: Ambulatory Visit | Attending: Licensed Practical Nurse | Admitting: Licensed Practical Nurse

## 2024-01-21 ENCOUNTER — Encounter: Payer: Self-pay | Admitting: Licensed Practical Nurse

## 2024-01-21 ENCOUNTER — Ambulatory Visit: Admitting: Licensed Practical Nurse

## 2024-01-21 VITALS — BP 138/65 | HR 87 | Ht 62.0 in | Wt 160.0 lb

## 2024-01-21 DIAGNOSIS — Z01419 Encounter for gynecological examination (general) (routine) without abnormal findings: Secondary | ICD-10-CM

## 2024-01-21 DIAGNOSIS — Z13820 Encounter for screening for osteoporosis: Secondary | ICD-10-CM

## 2024-01-21 DIAGNOSIS — Z1151 Encounter for screening for human papillomavirus (HPV): Secondary | ICD-10-CM | POA: Insufficient documentation

## 2024-01-21 DIAGNOSIS — Z124 Encounter for screening for malignant neoplasm of cervix: Secondary | ICD-10-CM

## 2024-01-21 NOTE — Progress Notes (Signed)
 Gynecology Annual Exam  PCP: Fernande Ophelia JINNY DOUGLAS, MD  Chief Complaint:  Chief Complaint  Patient presents with   Gynecologic Exam    History of Present Illness:Patient is a 68 y.o. G3P3001 presents for annual exam. The patient has no complaints today. She has struggled with constipation for years, would like to not need medication for it. Admits she does not always drink enough water, has had spontaneous BM on days that she increased her fluids. Has recently started a fiber supplement and Miralax, takes colace every 3 days.   LMP: No LMP recorded. Patient has had a hysterectomy.   The patient is not sexually active. She denies dyspareunia.  The patient does not perform self breast exams.  There is no notable family history of breast or ovarian cancer in her family.  The patient wears seatbelts: yes.   The patient has regular exercise: yes.  Walks a lot at work, dances with her grandchildren   The patient denies current symptoms of depression.    Works for [employer] with adult grandchild, feels safe,  PCP has, sees regularly  Dental exam > 1 year Wears glasses, last exam 1 year ago Gets Ca through dairy products    Review of Systems: Review of Systems  Constitutional: Negative.   HENT: Negative.    Eyes: Negative.   Respiratory: Negative.    Cardiovascular: Negative.   Gastrointestinal:  Positive for constipation.  Genitourinary: Negative.   Musculoskeletal: Negative.   Skin: Negative.   Neurological: Negative.   Endo/Heme/Allergies: Negative.   Psychiatric/Behavioral: Negative.      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    10/20/2019 Colposcopy VAIN I 08/12/2019 LGSIL HPV positive (16/18 negative) 06/20/2018  Colposcopy VAIN I at 9 O'Clock 05/03/2018 LGSIL pap HPV positive 11/01/2016 Colposcopy negative 10/03/2016 Pap LGSIL HPV positive 02/18/2015 Colposcopy VAIN 2 at 9 O'Clock biopsy 08/07/2014 Colposcopy VAIN 2 at 9 O'Clock biopsy 06/18/2014 Pap LGSIL  11/21/2013 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 at 12 to 3 O'Clock 10/09/2013 Colposcopy CIN II at 2 O;Clock, ECC HGSIL 09/14/2013 Pap LGSIL      Past Surgical History:  Past Surgical History:  Procedure Laterality Date   COLONOSCOPY WITH PROPOFOL  N/A 11/05/2019   Procedure: COLONOSCOPY WITH PROPOFOL ;  Surgeon: Toledo, Ladell POUR, MD;  Location: [facility];  Service: Gastroenterology;  Laterality: N/A;   COLPOSCOPY     ESOPHAGOGASTRODUODENOSCOPY (EGD) WITH PROPOFOL  N/A 11/05/2019   Procedure: ESOPHAGOGASTRODUODENOSCOPY (EGD) WITH PROPOFOL ;  Surgeon: Toledo, Ladell POUR, MD;  Location: [facility];  Service: Gastroenterology;  Laterality: N/A;   PARTIAL HYSTERECTOMY      Gynecologic History:  No LMP recorded. Patient has had a hysterectomy. Last Pap: Results were: 2024 low-grade squamous intraepithelial neoplasia (LGSIL - encompassing HPV,mild dysplasia,CIN I) s/p colpo 2023  Last mammogram: 2025 Results were: BI-RAD I  Obstetric History: G3P3001  Family History:  Family History  Problem Relation Age of Onset   Breast cancer Neg Hx    Bladder Cancer Neg Hx    Kidney cancer Neg Hx     Social History:  Social History   Socioeconomic History   Marital status: Single    Spouse name: Not on file   Number of children: Not on file  Years of education: Not on file   Highest education level: Not on file  Occupational History   Not on file  Tobacco Use   Smoking status: Every Day    Current packs/day: 0.50    Average packs/day: 0.5 packs/day for 46.0 years (23.0 ttl pk-yrs)    Types: Cigarettes   Smokeless tobacco: Never  Vaping Use   Vaping status: Never Used  Substance and Sexual Activity   Alcohol use: No   Drug use: No    Sexual activity: Yes    Birth control/protection: None  Other Topics Concern   Not on file  Social History Narrative   ** Merged History Encounter **       ** Merged History Encounter **       Social Drivers of Corporate investment banker Strain: Not on file  Food Insecurity: Not on file  Transportation Needs: Not on file  Physical Activity: Not on file  Stress: Not on file  Social Connections: Not on file  Intimate Partner Violence: Not on file    Allergies:  Allergies  Allergen Reactions   Atorvastatin     Other reaction(s): Liver Disorder Patient with cholestasis/elevated liver enzymes   Pravastatin     Other reaction(s): Muscle Pain    Medications: Prior to Admission medications   Medication Sig Start Date End Date Taking? Authorizing Provider  amLODipine (NORVASC) 2.5 MG tablet Take by mouth. 05/05/16  Yes [provider]  aspirin 81 MG EC tablet Take by mouth.   Yes [provider]  bisacodyl (DULCOLAX) 5 MG EC tablet Take 5 mg by mouth daily as needed for moderate constipation.   Yes [provider]  citalopram (CELEXA) 20 MG tablet Take 20 mg by mouth daily. 04/22/21  Yes [provider]  diclofenac sodium (VOLTAREN) 1 % GEL Apply topically 4 (four) times daily.   Yes [provider]  ezetimibe (ZETIA) 10 MG tablet Take 10 mg by mouth daily. 07/14/20  Yes [provider]  lactulose (CHRONULAC) 10 GM/15ML solution Take by mouth 3 (three) times daily.   Yes [provider]  meclizine  (ANTIVERT ) 25 MG tablet Take 1 tablet (25 mg total) by mouth 3 (three) times daily as needed for dizziness. 02/20/21  Yes Willo Dunnings, MD  meloxicam [facility]) 7.5 MG tablet  01/19/15  Yes [provider]  pantoprazole (PROTONIX) 40 MG tablet Take by mouth. 03/17/20 01/21/24 Yes [provider]  triamcinolone cream (KENALOG) 0.1 % Apply 1 application topically 2 (two) times daily.   Yes [provider]     Physical Exam Vitals: Blood pressure 138/65, pulse 87, height 5' 2 (1.575 m), weight 160 lb (72.6 kg).  General: NAD HEENT: normocephalic, anicteric Thyroid : no enlargement, no palpable nodules Pulmonary: No increased work of breathing, CTAB Cardiovascular: RRR, distal pulses 2+ Breast: Breast symmetrical, no tenderness, no palpable nodules or masses, no skin or nipple retraction present, no nipple discharge.  No axillary or supraclavicular lymphadenopathy. Abdomen: NABS, soft, non-tender, slightly distended.  Umbilicus without lesions.  No hepatomegaly, splenomegaly or masses palpable. No evidence of hernia  Genitourinary:  External: Normal external female genitalia for post menopausal state.  Normal urethral meatus, normal Bartholin's and Skene's glands.    Vagina: Normal vaginal mucosa, no evidence of prolapse.  Good tone   Cervix/vaginal cuff: Grossly normal in appearance, no bleeding  Uterus: surgically absent  Adnexa: ovaries non-enlarged, no adnexal masses  Rectal: deferred  Lymphatic: no evidence of inguinal lymphadenopathy Extremities: no  edema, erythema, or tenderness Neurologic: Grossly intact Psychiatric: mood appropriate, affect full    Assessment: 68 y.o. G3P3001 routine annual exam  Plan: Problem List Items Addressed This Visit   None Visit Diagnoses       Screening for osteoporosis    -  Primary   Relevant Orders   DG Bone Density     Cervical cancer screening       Relevant Orders   Cytology - PAP     Well woman exam       Relevant Orders   Cytology - PAP       1) Mammogram - recommend yearly screening mammogram.  Mammogram Is up to date  2) STI screening  wasoffered and declined  3) ASCCP guidelines and rational discussed.  Patient opts for based on results   4) Osteoporosis ordered  - per USPTF routine screening DEXA at age 45   Consider FDA-approved medical therapies in postmenopausal women and men aged 109 years and older, based on the  following: a) A hip or vertebral (clinical or morphometric) fracture b) T-score <= -2.5 at the femoral neck or spine after appropriate evaluation to exclude secondary causes C) Low bone mass (T-score between -1.0 and -2.5 at the femoral neck or spine) and a 10-year probability of a hip fracture >= 3% or a 10-year probability of a major osteoporosis-related fracture >= 20% based on the [place] -adapted WHO algorithm   5) Routine healthcare maintenance including cholesterol, diabetes screening discussed managed by PCP  6) Colonoscopy done in 2021 .  Screening recommended starting at age 46 for average risk individuals, age 22 for individuals deemed at increased risk (including African Americans) and recommended to continue until age 41.  For patient age 34-85 individualized approach is recommended.  Gold standard screening is via colonoscopy, Cologuard screening is an acceptable alternative for patient unwilling or unable to undergo colonoscopy.  Colorectal cancer screening for average?risk adults: 2018 guideline update from the American Cancer SocietyCA: A Cancer Journal for Clinicians: Sep 06, 2016   7) Rec increasing hydration, use fiber supplement which she is doing, add prunes, dried fruit or pears, peaches.    Jinnie Cookey, CNM  [facility] 01/21/2024, 4:21 PM

## 2024-01-25 LAB — CYTOLOGY - PAP
Adequacy: ABSENT
Comment: NEGATIVE
Comment: NEGATIVE
Comment: NEGATIVE
HPV 16: NEGATIVE
HPV 18 / 45: NEGATIVE
High risk HPV: POSITIVE — AB

## 2024-01-29 ENCOUNTER — Encounter: Payer: Self-pay | Admitting: Obstetrics & Gynecology

## 2024-03-11 ENCOUNTER — Ambulatory Visit: Admitting: Obstetrics & Gynecology

## 2024-03-11 ENCOUNTER — Encounter: Payer: Self-pay | Admitting: Obstetrics & Gynecology

## 2024-03-11 ENCOUNTER — Other Ambulatory Visit (HOSPITAL_COMMUNITY)
Admission: RE | Admit: 2024-03-11 | Discharge: 2024-03-11 | Disposition: A | Source: Ambulatory Visit | Attending: Obstetrics & Gynecology | Admitting: Obstetrics & Gynecology

## 2024-03-11 VITALS — BP 125/75 | HR 68 | Ht 62.0 in | Wt 157.6 lb

## 2024-03-11 DIAGNOSIS — B977 Papillomavirus as the cause of diseases classified elsewhere: Secondary | ICD-10-CM | POA: Insufficient documentation

## 2024-03-11 DIAGNOSIS — N890 Mild vaginal dysplasia: Secondary | ICD-10-CM

## 2024-03-11 DIAGNOSIS — R87622 Low grade squamous intraepithelial lesion on cytologic smear of vagina (LGSIL): Secondary | ICD-10-CM

## 2024-03-11 DIAGNOSIS — R87612 Low grade squamous intraepithelial lesion on cytologic smear of cervix (LGSIL): Secondary | ICD-10-CM | POA: Insufficient documentation

## 2024-03-11 NOTE — Progress Notes (Signed)
    GYNECOLOGY PROGRESS NOTE  Subjective:    Patient ID: Kathleen Riddle, female    DOB: [birth year], 68 y.o.   MRN: [id number]  HPI  Patient is a 68 y.o. single P3 here for a colpo of her vaginal cuff due to pap 01/2024 showing LGSIL with + HR HPV. 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 2023 VAIN 1 10/20/2019 Colposcopy VAIN I 08/12/2019 LGSIL HPV positive (16/18 negative) 06/20/2018 Colposcopy VAIN I at 9 O'Clock 05/03/2018 LGSIL pap HPV positive 11/01/2016 Colposcopy negative 10/03/2016 Pap LGSIL HPV positive 02/18/2015 Colposcopy VAIN 2 at 9 O'Clock biopsy 08/07/2014 Colposcopy VAIN 2 at 9 O'Clock biopsy 06/18/2014 Pap LGSIL  11/21/2013 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 at 12 to 3 O'Clock 10/09/2013 Colposcopy CIN II at 2 O;Clock, ECC HGSIL 09/14/2013 Pap LGSIL   The following portions of the patient's history were reviewed and updated as appropriate: allergies, current medications, past family history, past medical history, past social history, past surgical history, and problem list.  Review of Systems Pertinent items are noted in HPI.   Objective:   Blood pressure 125/75, pulse 68, height 5' 2 (1.575 m), weight 157 lb 9.6 oz (71.5 kg). Body mass index is 28.83 kg/m. Well nourished, well hydrated Black female, no apparent distress She is ambulating and conversing normally. Pederson spec used to visualize her vagina. No abnormalities prior to application of acetic acid. Acetic acid allowed to sit in vagina for about 2 minutes. There was a 2 x 1 cm area of the right vaginal fornix that is white and thickened. I biopsied the area and used silver nitrate to achieve hemostasis. She tolerated the procedure well.  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 LGSIL Pap smear of vagina  - Surgical pathology

## 2024-03-13 LAB — SURGICAL PATHOLOGY
